# Patient Record
Sex: Female | Born: 1960
Health system: Southern US, Community
[De-identification: ages and names within clinical notes are randomized; demographics above are authoritative.]

## PROBLEM LIST (undated history)

## (undated) DIAGNOSIS — E119 Type 2 diabetes mellitus without complications: Secondary | ICD-10-CM

## (undated) DIAGNOSIS — I10 Essential (primary) hypertension: Secondary | ICD-10-CM

## (undated) DIAGNOSIS — E7800 Pure hypercholesterolemia, unspecified: Secondary | ICD-10-CM

## (undated) DIAGNOSIS — D649 Anemia, unspecified: Secondary | ICD-10-CM

## (undated) DIAGNOSIS — E669 Obesity, unspecified: Secondary | ICD-10-CM

## (undated) HISTORY — DX: Anemia, unspecified: D64.9

## (undated) HISTORY — DX: Obesity, unspecified: E66.9

## (undated) HISTORY — PX: ABDOMINAL HYSTERECTOMY: SHX81

## (undated) HISTORY — PX: GASTRIC BYPASS: SHX52

## (undated) HISTORY — DX: Pure hypercholesterolemia, unspecified: E78.00

## (undated) HISTORY — DX: Essential (primary) hypertension: I10

---

## 1998-06-24 ENCOUNTER — Encounter: Admission: RE | Admit: 1998-06-24 | Discharge: 1998-09-22 | Payer: Self-pay | Admitting: Obstetrics and Gynecology

## 1998-07-02 ENCOUNTER — Other Ambulatory Visit: Admission: RE | Admit: 1998-07-02 | Discharge: 1998-07-02 | Payer: Self-pay | Admitting: *Deleted

## 1998-08-07 ENCOUNTER — Ambulatory Visit (HOSPITAL_COMMUNITY): Admission: RE | Admit: 1998-08-07 | Discharge: 1998-08-07 | Payer: Self-pay | Admitting: Obstetrics and Gynecology

## 1998-08-07 ENCOUNTER — Encounter: Payer: Self-pay | Admitting: Obstetrics and Gynecology

## 1998-11-04 ENCOUNTER — Observation Stay (HOSPITAL_COMMUNITY): Admission: AD | Admit: 1998-11-04 | Discharge: 1998-11-05 | Payer: Self-pay | Admitting: Obstetrics and Gynecology

## 1998-11-20 ENCOUNTER — Ambulatory Visit (HOSPITAL_COMMUNITY): Admission: RE | Admit: 1998-11-20 | Discharge: 1998-11-20 | Payer: Self-pay | Admitting: Obstetrics and Gynecology

## 1998-11-20 ENCOUNTER — Encounter: Payer: Self-pay | Admitting: Obstetrics and Gynecology

## 1998-12-04 ENCOUNTER — Encounter: Payer: Self-pay | Admitting: Obstetrics and Gynecology

## 1998-12-04 ENCOUNTER — Inpatient Hospital Stay (HOSPITAL_COMMUNITY): Admission: RE | Admit: 1998-12-04 | Discharge: 1998-12-04 | Payer: Self-pay | Admitting: Obstetrics

## 1998-12-09 ENCOUNTER — Encounter (HOSPITAL_COMMUNITY): Admission: RE | Admit: 1998-12-09 | Discharge: 1999-01-13 | Payer: Self-pay | Admitting: *Deleted

## 1998-12-16 ENCOUNTER — Encounter: Payer: Self-pay | Admitting: Obstetrics & Gynecology

## 1998-12-23 ENCOUNTER — Inpatient Hospital Stay (HOSPITAL_COMMUNITY): Admission: AD | Admit: 1998-12-23 | Discharge: 1998-12-23 | Payer: Self-pay | Admitting: Obstetrics & Gynecology

## 1998-12-31 ENCOUNTER — Encounter: Payer: Self-pay | Admitting: Obstetrics & Gynecology

## 1999-01-12 ENCOUNTER — Encounter (INDEPENDENT_AMBULATORY_CARE_PROVIDER_SITE_OTHER): Payer: Self-pay | Admitting: Specialist

## 1999-01-12 ENCOUNTER — Inpatient Hospital Stay (HOSPITAL_COMMUNITY): Admission: AD | Admit: 1999-01-12 | Discharge: 1999-01-15 | Payer: Self-pay | Admitting: Obstetrics and Gynecology

## 1999-01-16 ENCOUNTER — Encounter: Admission: RE | Admit: 1999-01-16 | Discharge: 1999-04-16 | Payer: Self-pay | Admitting: Obstetrics and Gynecology

## 1999-06-24 ENCOUNTER — Other Ambulatory Visit: Admission: RE | Admit: 1999-06-24 | Discharge: 1999-06-24 | Payer: Self-pay | Admitting: Obstetrics and Gynecology

## 2000-07-25 ENCOUNTER — Ambulatory Visit (HOSPITAL_COMMUNITY): Admission: RE | Admit: 2000-07-25 | Discharge: 2000-07-25 | Payer: Self-pay | Admitting: Internal Medicine

## 2001-12-12 ENCOUNTER — Encounter: Payer: Self-pay | Admitting: Internal Medicine

## 2001-12-12 ENCOUNTER — Ambulatory Visit (HOSPITAL_COMMUNITY): Admission: RE | Admit: 2001-12-12 | Discharge: 2001-12-12 | Payer: Self-pay | Admitting: Internal Medicine

## 2001-12-14 ENCOUNTER — Other Ambulatory Visit: Admission: RE | Admit: 2001-12-14 | Discharge: 2001-12-14 | Payer: Self-pay | Admitting: Obstetrics and Gynecology

## 2002-03-28 ENCOUNTER — Ambulatory Visit (HOSPITAL_COMMUNITY): Admission: RE | Admit: 2002-03-28 | Discharge: 2002-03-28 | Payer: Self-pay | Admitting: Internal Medicine

## 2002-03-28 ENCOUNTER — Encounter: Payer: Self-pay | Admitting: Internal Medicine

## 2003-01-23 ENCOUNTER — Other Ambulatory Visit: Admission: RE | Admit: 2003-01-23 | Discharge: 2003-01-23 | Payer: Self-pay | Admitting: Obstetrics and Gynecology

## 2003-01-23 ENCOUNTER — Ambulatory Visit (HOSPITAL_COMMUNITY): Admission: RE | Admit: 2003-01-23 | Discharge: 2003-01-23 | Payer: Self-pay | Admitting: Obstetrics and Gynecology

## 2003-05-03 ENCOUNTER — Ambulatory Visit (HOSPITAL_COMMUNITY): Admission: RE | Admit: 2003-05-03 | Discharge: 2003-05-03 | Payer: Self-pay | Admitting: General Surgery

## 2003-05-08 ENCOUNTER — Encounter: Admission: RE | Admit: 2003-05-08 | Discharge: 2003-05-08 | Payer: Self-pay | Admitting: General Surgery

## 2003-05-10 ENCOUNTER — Encounter: Admission: RE | Admit: 2003-05-10 | Discharge: 2003-08-08 | Payer: Self-pay | Admitting: General Surgery

## 2003-05-11 ENCOUNTER — Ambulatory Visit (HOSPITAL_BASED_OUTPATIENT_CLINIC_OR_DEPARTMENT_OTHER): Admission: RE | Admit: 2003-05-11 | Discharge: 2003-05-11 | Payer: Self-pay | Admitting: General Surgery

## 2003-05-17 ENCOUNTER — Encounter: Admission: RE | Admit: 2003-05-17 | Discharge: 2003-05-17 | Payer: Self-pay | Admitting: General Surgery

## 2003-06-04 ENCOUNTER — Ambulatory Visit (HOSPITAL_COMMUNITY): Admission: RE | Admit: 2003-06-04 | Discharge: 2003-06-04 | Payer: Self-pay | Admitting: *Deleted

## 2003-06-04 ENCOUNTER — Encounter (INDEPENDENT_AMBULATORY_CARE_PROVIDER_SITE_OTHER): Payer: Self-pay | Admitting: *Deleted

## 2003-08-20 ENCOUNTER — Inpatient Hospital Stay (HOSPITAL_COMMUNITY): Admission: RE | Admit: 2003-08-20 | Discharge: 2003-08-22 | Payer: Self-pay | Admitting: General Surgery

## 2003-08-20 ENCOUNTER — Encounter (INDEPENDENT_AMBULATORY_CARE_PROVIDER_SITE_OTHER): Payer: Self-pay | Admitting: Specialist

## 2003-08-27 ENCOUNTER — Encounter: Admission: RE | Admit: 2003-08-27 | Discharge: 2003-11-25 | Payer: Self-pay | Admitting: General Surgery

## 2004-02-11 ENCOUNTER — Ambulatory Visit (HOSPITAL_COMMUNITY): Admission: RE | Admit: 2004-02-11 | Discharge: 2004-02-11 | Payer: Self-pay | Admitting: Obstetrics and Gynecology

## 2004-03-11 ENCOUNTER — Other Ambulatory Visit: Admission: RE | Admit: 2004-03-11 | Discharge: 2004-03-11 | Payer: Self-pay | Admitting: Obstetrics and Gynecology

## 2005-03-28 IMAGING — RF DG UGI W/ GASTROGRAFIN
13 series · 13 of 13 positions shown · non-contrast
Comparison: none

CLINICAL DATA: Status-post gastric bypass procedure.  
GASTROGRAFIN STUDY 
Scout view reveals postoperative changes with drain and surgical clips.  The patient ingested Gastrografin.  Upon first swallow, it became obvious that there is a significant stenosis at the gastro-jejunal anastomosis.   Further ingestion of contrast was terminated at this point.  The patient was kept in a predominately erect position for 20 minutes and contrast did not completely empty from the gastric pouch or esophagus.  The patient was sent back to room with orders to keep head of bead at 30 degrees for the next 8 hours.  Exam is limited with respect to detecting leak.  No leak is identified.  Results discussed with Dr. Kofa.  As contrast filled views of the jejunal loop are somewhat limited given the amount of contrast ingested, distal anastomotic leak is difficult to exclude although no obvious leak demonstrated.  
IMPRESSION
Significant narrowing gastro-jejunal anastomosis.  This limits exam as significant amount of contrast was not ingested.  No leak identified on this limited exam.

[Series 1: run · 1 of 1 slices shown (1 of 10)]
[im 1/1]
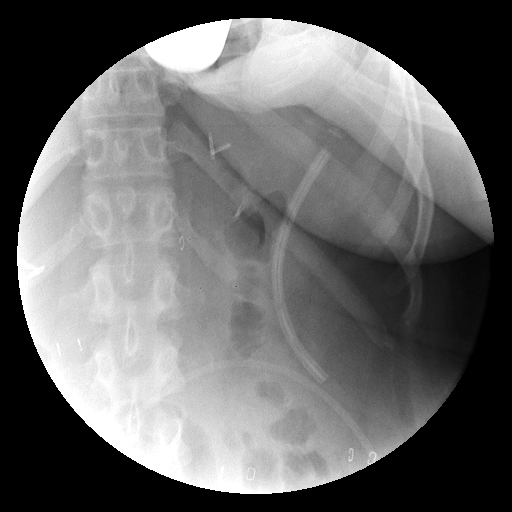

[Series 2: run · 1 of 1 slices shown (2 of 10)]
[im 1/1]
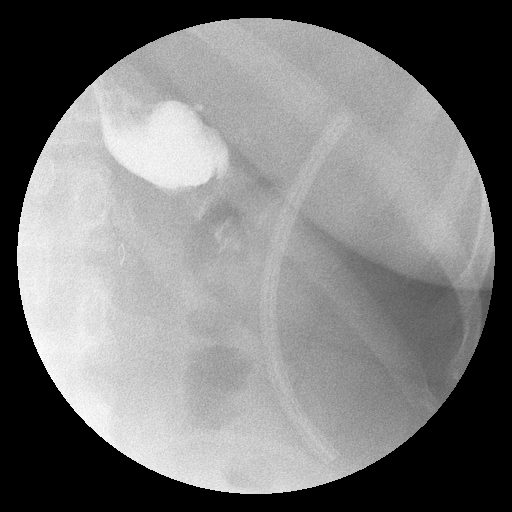

[Series 3: run · 1 of 1 slices shown (3 of 10)]
[im 1/1]
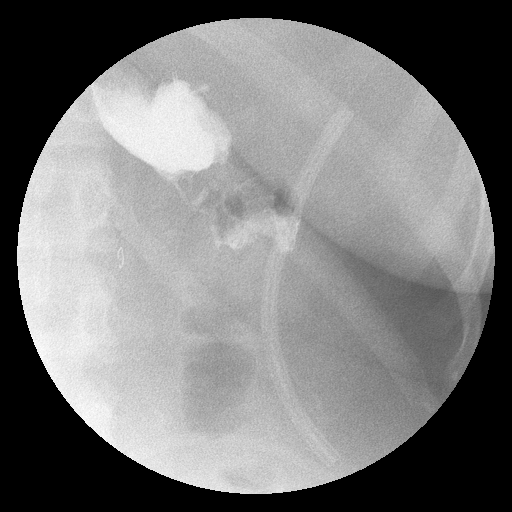

[Series 4: run · 1 of 1 slices shown (4 of 10)]
[im 1/1]
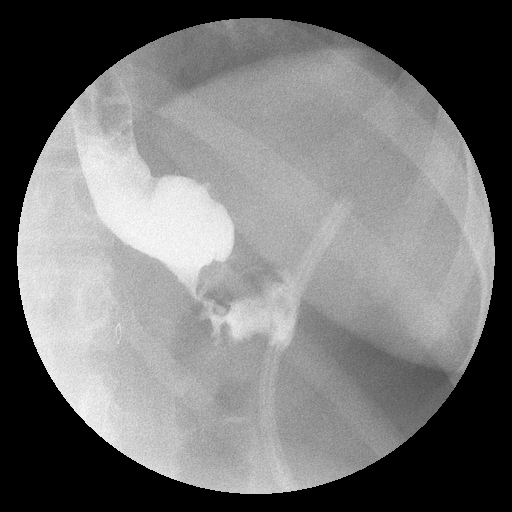

[Series 5: run · 1 of 1 slices shown (5 of 10)]
[im 1/1]
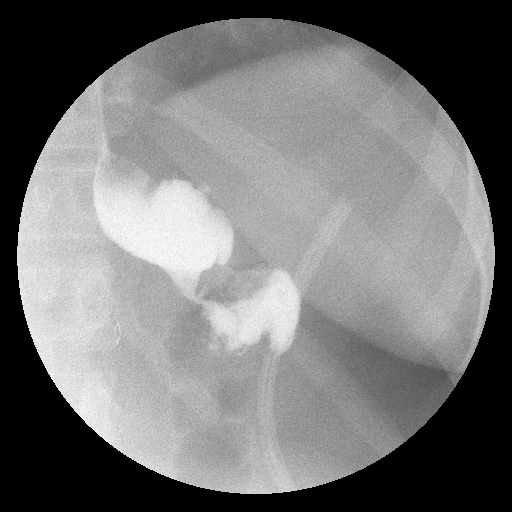

[Series 6: run · 1 of 1 slices shown (6 of 10)]
[im 1/1]
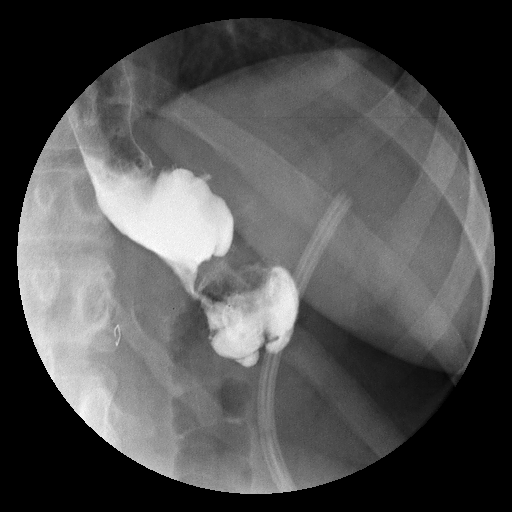

[Series 7: run · 1 of 1 slices shown (7 of 10)]
[im 1/1]
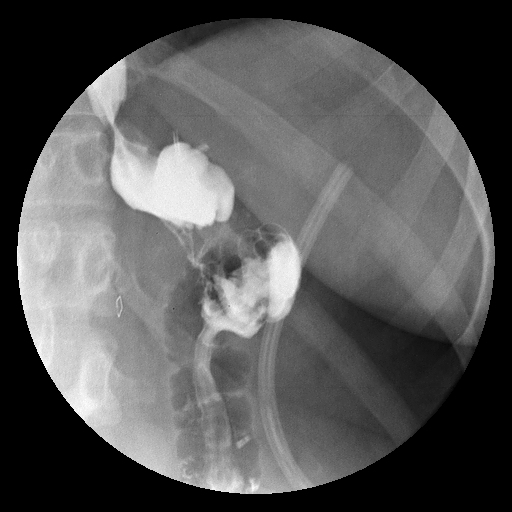

[Series 8: run · 1 of 1 slices shown (8 of 10)]
[im 1/1]
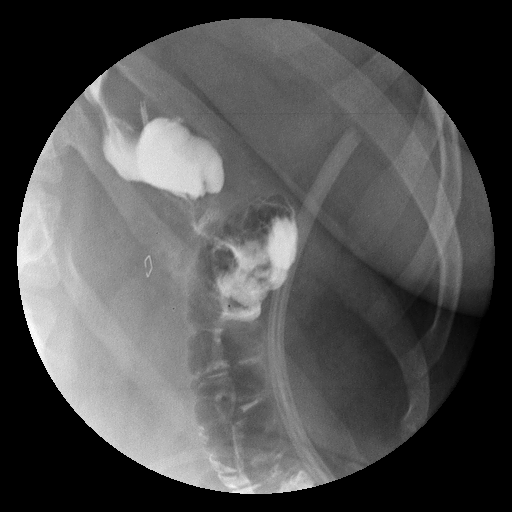

[Series 9: run · 1 of 1 slices shown (9 of 10)]
[im 1/1]
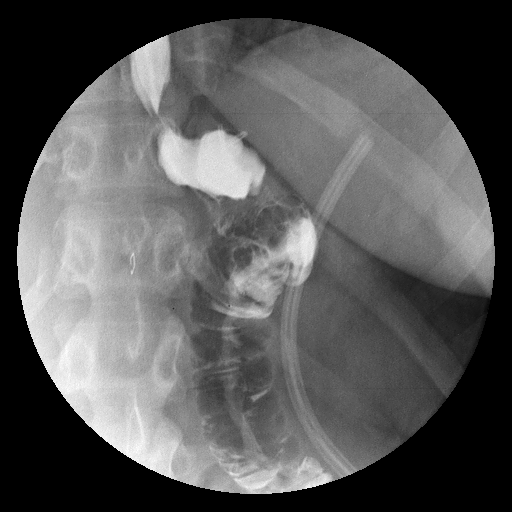

[Series 10: run · 1 of 1 slices shown (10 of 10)]
[im 1/1]
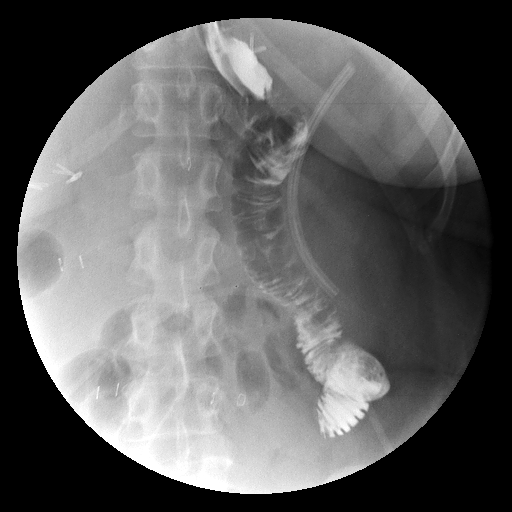

[Series 1001: view not recorded · 0.20mm/px · 1 of 1 slices shown (1 of 3)]
[im 1/1]
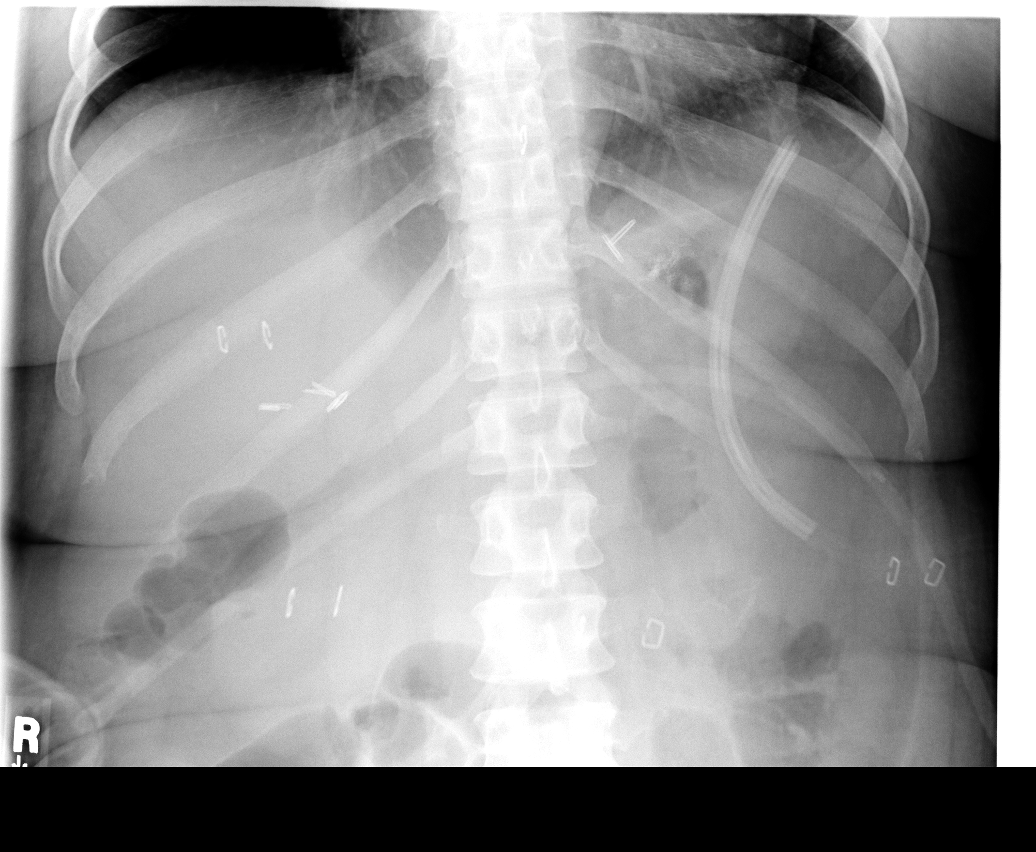

[Series 1002: view not recorded · 0.20mm/px · 1 of 1 slices shown (2 of 3)]
[im 1/1]
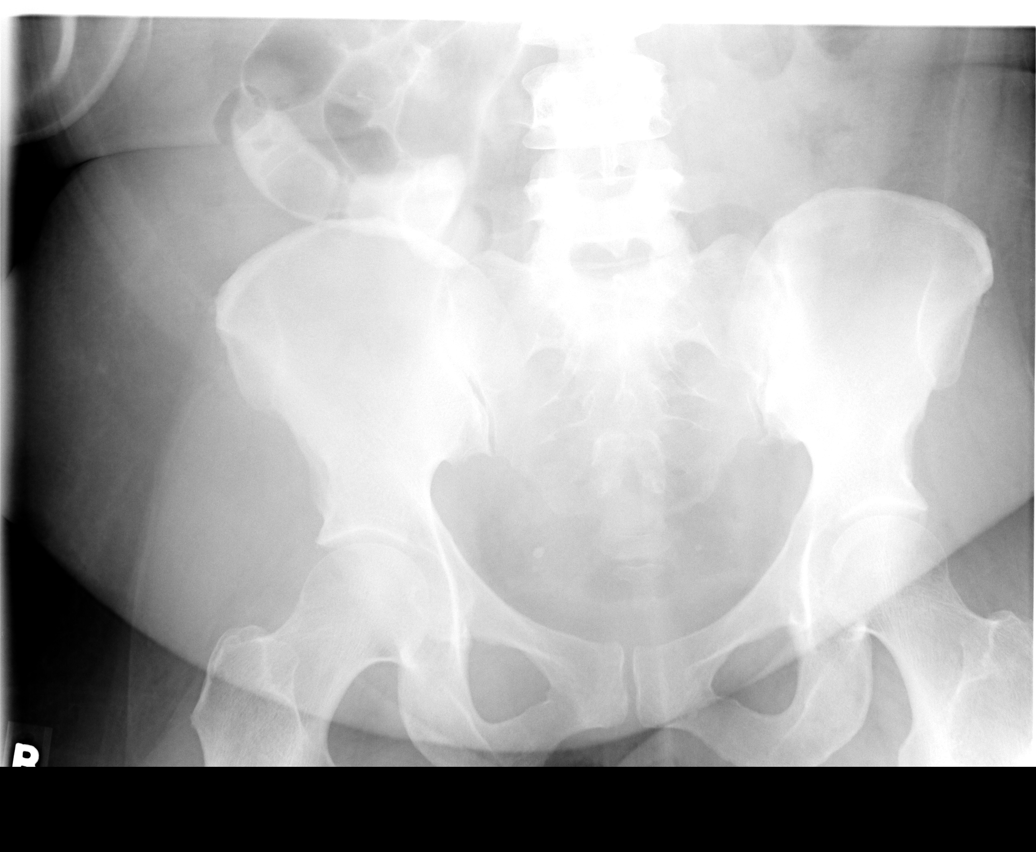

[Series 1003: view not recorded · 0.20mm/px · 1 of 1 slices shown (3 of 3)]
[im 1/1]
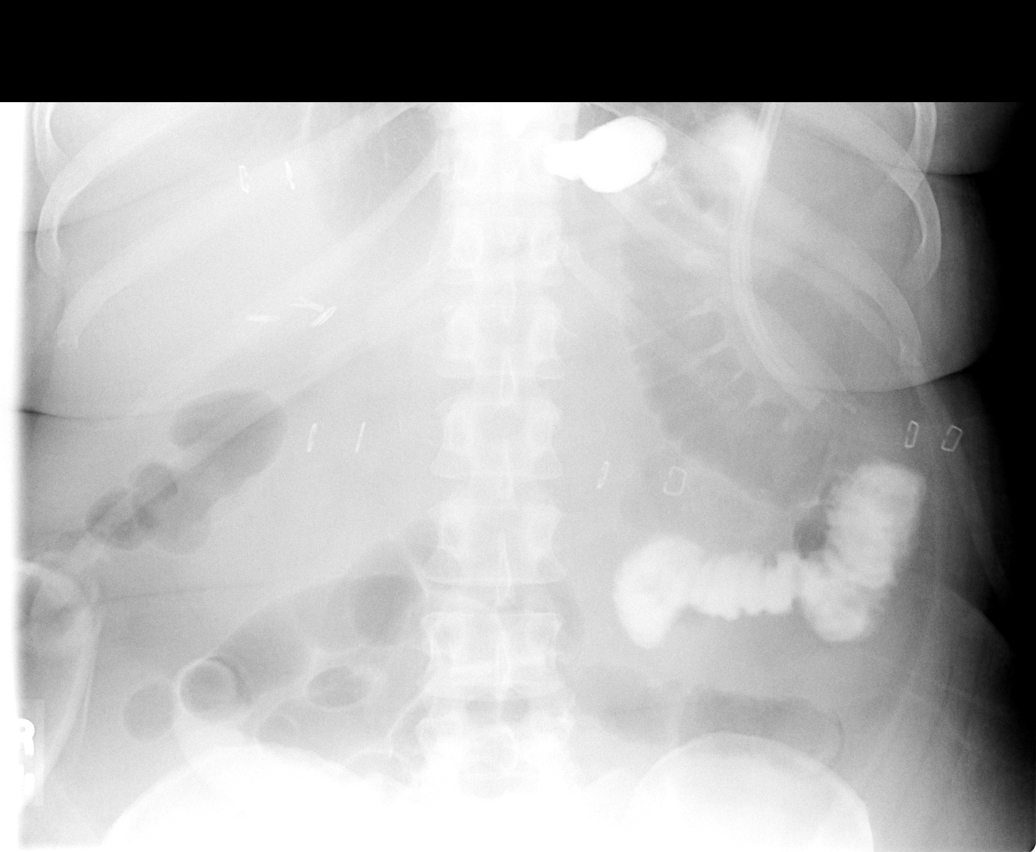

[13 of 13 positions shown; findings below may reference images not displayed]

## 2005-04-05 ENCOUNTER — Ambulatory Visit (HOSPITAL_COMMUNITY): Admission: RE | Admit: 2005-04-05 | Discharge: 2005-04-05 | Payer: Self-pay | Admitting: Obstetrics and Gynecology

## 2005-05-04 ENCOUNTER — Other Ambulatory Visit: Admission: RE | Admit: 2005-05-04 | Discharge: 2005-05-04 | Payer: Self-pay | Admitting: Obstetrics and Gynecology

## 2006-05-04 ENCOUNTER — Ambulatory Visit (HOSPITAL_COMMUNITY): Admission: RE | Admit: 2006-05-04 | Discharge: 2006-05-04 | Payer: Self-pay | Admitting: Obstetrics and Gynecology

## 2006-11-17 ENCOUNTER — Emergency Department (HOSPITAL_COMMUNITY): Admission: EM | Admit: 2006-11-17 | Discharge: 2006-11-17 | Payer: Self-pay | Admitting: Emergency Medicine

## 2007-03-08 ENCOUNTER — Encounter (INDEPENDENT_AMBULATORY_CARE_PROVIDER_SITE_OTHER): Payer: Self-pay | Admitting: Obstetrics and Gynecology

## 2007-03-08 ENCOUNTER — Ambulatory Visit (HOSPITAL_COMMUNITY): Admission: RE | Admit: 2007-03-08 | Discharge: 2007-03-09 | Payer: Self-pay | Admitting: Obstetrics and Gynecology

## 2008-02-07 ENCOUNTER — Ambulatory Visit (HOSPITAL_COMMUNITY): Admission: RE | Admit: 2008-02-07 | Discharge: 2008-02-07 | Payer: Self-pay | Admitting: Internal Medicine

## 2008-08-01 ENCOUNTER — Emergency Department (HOSPITAL_COMMUNITY): Admission: EM | Admit: 2008-08-01 | Discharge: 2008-08-01 | Payer: Self-pay | Admitting: Family Medicine

## 2010-01-15 ENCOUNTER — Ambulatory Visit (HOSPITAL_COMMUNITY): Admission: RE | Admit: 2010-01-15 | Discharge: 2010-01-15 | Payer: Self-pay | Admitting: Obstetrics and Gynecology

## 2010-07-21 NOTE — Op Note (Signed)
Miranda Hernandez, Miranda Hernandez              ACCOUNT NO.:  [id number]   MEDICAL RECORD NO.:  [id number]          PATIENT TYPE:  OIB   LOCATION:  9310                          FACILITY:  [facility]   PHYSICIAN:  Janine Limbo, M.D.DATE OF BIRTH:  [birth year]   DATE OF PROCEDURE:  DATE OF DISCHARGE:                               OPERATIVE REPORT   PREOPERATIVE DIAGNOSIS:  1. Menorrhagia  2. Anemia (hemoglobin 7.6)  3. Fibroid uterus.  4. High-grade squamous intraepithelial lesion of the cervix.  5. Obesity.  6. Two prior cesarean sections.  7. Hypertension.   POSTOPERATIVE DIAGNOSIS:  1. Menorrhagia  2. Anemia (hemoglobin 7.6)  3. Fibroid uterus.  4. High-grade squamous intraepithelial lesion of the cervix.  5. Obesity.  6. Two prior cesarean sections.  7. Hypertension  8. Pelvic adhesions.   PROCEDURE:  Vaginal hysterectomy with uterine morcellation.   SURGEON:  Dr. Leonard Schwartz   FIRST ASSISTANT:  Dr. Dierdre Forth.   ANESTHESIA:  General.   DISPOSITION:  Miranda Hernandez is a 50 year old female, para 2-0-1-2, who  presents with the above-mentioned diagnosis.  Hormonal therapy has not  controlled her bleeding.  The patient wishes to proceed with definitive  therapy at this time.  She understands the indications for her surgical  procedure and she accepts the risks of, but not limited to, anesthetic  complications, bleeding, infection, and possible damage to the  surrounding organs.   FINDINGS:  A 10-12 weeks size multi fibroid uterus was present.  The  patient has had two prior cesarean sections and she was noted to have  adhesions in the right adnexa.  The fallopian tube and the ovaries  otherwise appeared normal.   PROCEDURE:  The patient was taken to the operating room where a general  anesthetic was given.  The patient's lower abdomen, perineum, and vagina  were prepped with multiple layers of Betadine.  A Foley catheter was  placed in the bladder.  Examination under  anesthesia was performed.  The  cervix was injected with a diluted solution of Pitressin and saline.  A  circumferential incision was made around the cervix and the vaginal  cuff.  Mucosa was advanced anteriorly and posteriorly.  The posterior  cul-de-sac was sharply entered.  Attempts were made to enter the  anterior cul-de-sac but these were initially unsuccessful.  Alternating  from left-to-right the uterosacral ligaments, paracervical tissues,  parametrial tissues, and uterine arteries were clamped, cut, sutured,  and tied securely.  Attempts were made to invert the uterus through the  posterior colpotomy.  These were unsuccessful.  We then advanced the  mucosa anteriorly even further.  We were able to sharply enter the  anterior cul-de-sac.  We continued to clamp the parametrial tissues as  we approached the adnexa.  We were able to isolate the left adnexal  area.  The area was clamped, cut, free tied, and then suture ligated.  Difficulty was encountered clamping the right upper pedicle.  We then  began to morcellate the uterus.  Bleeding was encountered.  Hemostasis  was achieved using clamps and figure-of-eight sutures.  We were  able to  finally isolate the right upper pedicle.  The uterus was transected from  the operative field.  Bleeding was encountered and again hemostasis was  achieved using figure-of-eight sutures.  The uterus was handed from the  operative field.  Once hemostasis was achieved, the sutures attached to  the uterosacral ligaments were brought out through the vaginal angles.  A McCall culdoplasty suture was placed in the posterior cul-de-sac  incorporating the uterosacral ligaments bilaterally and the posterior  peritoneum.  A final check was made for hemostasis and hemostasis was  adequate throughout.  The bowel and the ovaries were carefully inspected  and there was no evidence of damage.  We then closed the vaginal cuff  using figure-of-eight sutures  incorporating the anterior vaginal mucosa,  the anterior peritoneum, the posterior peritoneum, and then the  posterior vaginal mucosa.  The McCall culdoplasty suture was tied  securely and the apex of the vagina was noted to elevate into the mid  pelvis.  Sponge, needle, and instrument counts were correct on two  occasions. The estimated blood loss for the procedure was 600 mL.  The  patient tolerated her procedure well.  The patient was noted to drain  500 mL of clear yellow urine at the end of her procedure.  She received  3 liters of IV fluid during the operative procedure.  The patient was  awakened from her anesthetic without difficulty and taken to the  recovery room in stable condition.  The uterus was sent to pathology for  evaluation.      Janine Limbo, M.D.  Electronically Signed     AVS/MEDQ  D:  03/09/2007  T:  03/09/2007  Job:  [id number]

## 2010-07-21 NOTE — H&P (Signed)
NAME:  Miranda Hernandez, Miranda Hernandez NO.:  [id number]   MEDICAL RECORD NO.:  [id number]          PATIENT TYPE:  AMB   LOCATION:  SDC                           FACILITY:  [facility]   PHYSICIAN:  Janine Limbo, M.D.DATE OF BIRTH:  15-Mar-1960   DATE OF ADMISSION:  DATE OF DISCHARGE:                              HISTORY & PHYSICAL   HISTORY OF PRESENT ILLNESS:  Ms. Miranda Hernandez is a 50 year old female, para 61-  0-1-2, who presents for a vaginal hysterectomy.  The patient has been  followed at the [facility] and Gyne[facility]  [facility].  The the patient complains of  menorrhagia.  She has a known history of fibroids.  An ultrasound was  performed and it showed a uterus that measures 10.91 cm x 7.29 cm.  Multiple fibroids were noted with the largest fibroid measuring 3.66 cm.  The ovaries appeared normal on ultrasound.  An endometrial biopsy was  performed that showed a benign endometrium.  The patient's most recent  Pap smear showed a high-grade squamous intraepithelial lesion.  Colposcopy and biopsies were performed and the patient was found to have  a high-grade squamous intraepithelial lesion.  The endocervical  curettage was negative.  The patient has decided to proceed with  definitive therapy.  The patient has had two cesarean sections in the  past and a tubal ligation.   DRUG ALLERGIES:  NO KNOWN DRUG ALLERGIES.   OBSTETRICAL HISTORY:  The patient had a cesarean section and tubal  ligation in November 2000.  She had a cesarean section at [redacted] weeks  gestation in 1988.  In 1998, the patient had an intrauterine fetal  demise at [redacted] weeks gestation.   PAST MEDICAL HISTORY:  1. Hypertension.  2. History of gestational diabetes.  3. History of anemia and her hemoglobin has been as low as 8.5.  4. Bariatric surgery in the past and has done well.   CURRENT MEDICATIONS:  Include Lotrel, Toprol, hydrochlorothiazide, iron,  B12 shots, and  multivitamins.   SOCIAL HISTORY:  The patient denies cigarette use, alcohol use, and  recreational drug use.   REVIEW OF SYSTEMS:  Please see history of present illness.   FAMILY HISTORY:  Noncontributory   PHYSICAL EXAMINATION:  VITAL SIGNS:  Weight is 198 pounds.  HEENT:  Within normal limits.  CHEST:  Clear.  HEART:  Regular rate and rhythm.  BREASTS:  Without masses.  ABDOMEN:  Soft and nontender.  EXTREMITIES:  Grossly normal.  NEUROLOGIC:  Grossly normal.  PELVIC:  External genitalia is normal.  Vagina is normal.  Cervix is  nontender (colposcopic changes are noted).  Uterus is 10-week size and  irregular.  Adnexa no masses.  Rectovaginal examination confirms.   ASSESSMENT:  1. Menorrhagia.  2. Anemia.  3. Fibroids.  4. High-grade squamous intraepithelial lesion.  5. Hypertension.  6. Status post two cesarean sections and one bilateral tubal ligation.  7. Status post bariatric surgery.   PLAN:  The patient will undergo a vaginal hysterectomy.  The patient  understands the indications for her surgical procedure and she accepts  the risks of, but not limited to, anesthetic complications, bleeding,  infection, and possible damage to surrounding organs.      Janine Limbo, M.D.  Electronically Signed     AVS/MEDQ  D:  03/05/2007  T:  03/06/2007  Job:  [id number]

## 2010-07-21 NOTE — Discharge Summary (Signed)
NAMEVIVECA, Miranda Hernandez              ACCOUNT NO.:  [id number]   MEDICAL RECORD NO.:  [id number]          PATIENT TYPE:  OIB   LOCATION:  9310                          FACILITY:  [facility]   PHYSICIAN:  Janine Limbo, M.D.DATE OF BIRTH:  06-15-1960   DATE OF ADMISSION:  03/08/2007  DATE OF DISCHARGE:  03/09/2007                               DISCHARGE SUMMARY   PREOPERATIVE DIAGNOSIS:  1. Menorrhagia.  2. Anemia (hemoglobin 7.6).  3. Fibroid uterus.  4. High-grade squamous intraepithelial lesion of the cervix.  5. Obesity.  6. Two prior cesarean sections.  7. Hypertension.   POSTOPERATIVE DIAGNOSIS:  1. Menorrhagia.  2. Anemia (hemoglobin 7.6).  3. Fibroid uterus.  4. High-grade squamous intraepithelial lesion of the cervix.  5. Obesity.  6. Two prior cesarean sections.  7. Hypertension.  8. Pelvic adhesions.   PROCEDURE THIS ADMISSION:  March 08, 2007  1. Vaginal hysterectomy with uterine morcellation  2. Blood transfusion for 2 units.   HISTORY OF PRESENT ILLNESS:  Ms. Miranda Hernandez is a 50 year old female, para 2-  0-1-2, who presents with the above-mentioned diagnosis.  She has decided  to proceed with definitive therapy.  Please see her dictated history and  physical exam for details.   ADMISSION EXAM:  The patient's weight is 198 pounds.  The uterus is 10-  week size and irregular.   HOSPITAL COURSE:  On the day of admission the patient was taken to the  operating room where a general anesthetic was given.  The patient had a  vaginal hysterectomy.  Operative findings included a 10-week size  fibroid uterus (178 grams).  There were adhesions in the right adnexa.  The procedure was complicated by bleeding from the adhesions in the  right adnexa.  The patient did tolerated her procedure well.  The  estimated blood loss for the procedure was 600 mL.  The patient  tolerated her procedure well.  Her postoperative hemoglobin was 6.4.  The patient was transfused 2 units of  packed red blood cells.  Her post  transfusion hemoglobin was 8.2.  The patient remained afebrile.  The  patient was not orthostatic.  She quickly tolerated a regular diet.  She  was ready for discharge on March 09, 2007.   DISCHARGE MEDICATIONS:  1. Vicodin 1 or 2 tablets q.4 h. as needed for pain.  2. Acetaminophen 1 or 2 tablets q.6 h. as needed for mild-to-moderate      pain.  3. Zofran 8 mg q.8 h. as needed for nausea.  4. Iron 325 mg twice each day for 6-8 weeks.  5. Stool softeners daily.  6. The patient will continue her preoperative medications.   DISCHARGE INSTRUCTIONS:  The patient will return to see Dr. Stefano Gaul in  6 weeks for followup examination.  She will refrain from driving for 1  week, heavy lifting for 4 weeks, and intercourse for 6 weeks.  She will  call for questions or concerns.  She was given a copy of the  postoperative instruction sheet as prepared by the [facility].  FINAL PATHOLOGY:  Pending at the time of discharge.      Janine Limbo, M.D.  Electronically Signed     AVS/MEDQ  D:  03/09/2007  T:  03/09/2007  Job:  [id number]

## 2010-07-24 NOTE — Discharge Summary (Signed)
[facility]  Patient:    Miranda Hernandez                        MRN: [id number] Adm. Date:  [id number] Disc. Date: 01/15/99 Attending:  Leonard Schwartz Dictator:   Erin Sons, C.N.M.                           Discharge Summary  ADMITTING DIAGNOSES: 1. Term pregnancy. 2. Complete breech presentation. 3. Previous cesarean section. 4. Desires sterilization. 5. Obesity.  DISCHARGE DIAGNOSES: 1. Term pregnancy. 2. Complete breech presentation. 3. Previous cesarean section. 4. Desires sterilization. 5. Obesity.  PROCEDURES: 1. Repeat low transverse cesarean section. 2. Bilateral tubal ligation. 3. Spinal anesthesia.  HOSPITAL COURSE:  [name] is a 50 year old gravida 3, para 1-1-0-1, who was admitted on January 12, 1999 at 37-5/7ths weeks with history of a previous cesarean section who is admitted for a planned C-section.  Patients pregnancy had been remarkable for 1) history of intrauterine fetal death at 21 weeks in a previous  pregnancy, 2) insulin-dependent diabetes, 3) chronic hypertension, 4) history of kidney stone, 5) advanced maternal age with normal amniocentesis, 6) history of  heart murmur without symptoms or antibiotics, and 8) positive group B strep.  Patient was taken to the operating room where a repeat low transverse cesarean section was performed under spinal anesthesia by Dr. Marline Backbone. Findings were a viable female by the name of Gerri Spore, 7 pounds 5 ounces, Apgars were 8 and , from a complete breech presentation.  Normal tubes and ovaries were noted and bilateral tubal ligation was also performed.  Estimated blood loss was 500 cc.  Patient tolerated the procedure well.  Infant was taken to the full-term nursery.  By postoperative day #1 patient was doing well.  She was using Benadryl for itching instead of Nubain.  Patient was doing both breast and bottle feeding since the aby was not feeding very  well at the breast.  Hemoglobin was 8.2 which was down from 10.4 pre-delivery.  The platelets were 137, her WBC was 10.1.  Her incision was in good condition and her physical examination was within normal limits.  Her blood pressure was stable.  The plan was made to repeat the CBC in the morning. Patient was continued on Aldomet.  She was up ad lib and having no issues with the anemia. By postoperative day #2 she was doing well.  Her CBGs were within normal limits. She had had a Jackson-Pratt drain placed during surgery and she was having less  than 5 cc noted.  Her hemoglobin was 7.9.  Nutrition consult was obtained and education was given.  Jackson-Pratt drain was removed on the evening of January 14, 1999 without difficulty by Dr. Stefano Gaul.  On January 15, 1999, postoperative day #3, patient was doing well.  She was breast and bottle feeding.  Hemoglobin was  7.5, platelets were 139.  Physical examination was within normal limits. Incision was clean, dry and intact with staples intact.  Dr. Pennie Rushing saw the patient and  determined that staples would remain in until November 13 due to the patients diabetes and obesity.  Patient was deemed then to have received the full benefit of her hospital stay nd was discharged home.  DISCHARGE INSTRUCTIONS:  Per [facility] handout.  DISCHARGE MEDICATIONS: 1. Motrin 600 mg p.o. q.6h. p.r.n. pain. 2. Tylox 1-2 p.o. q.3-4h. p.r.n. pain. 3. Iron  supplementation one p.o. b.i.d. 4. Aldomet 500 mg p.o. t.i.d. 5. Prenatal vitamin one p.o. q.d.  FOLLOWUP:  January 19, 1999 at [facility] for staple discontinuance and then at six weeks postpartum for routine postpartum exam.DD:  01/15/99 TD:  01/16/99 Job: [id number]

## 2010-07-24 NOTE — Discharge Summary (Signed)
NAME:  Miranda, Hernandez                        ACCOUNT NO.:  [id number]   MEDICAL RECORD NO.:  [id number]                   PATIENT TYPE:  INP   LOCATION:  0473                                 FACILITY:  [facility]   PHYSICIAN:  Sharlet Salina T. Hoxworth, M.D.          DATE OF BIRTH:  11/19/1960   DATE OF ADMISSION:  08/20/2003  DATE OF DISCHARGE:  08/22/2003                                 DISCHARGE SUMMARY   DISCHARGE DIAGNOSIS:  1. Morbid obesity.  2. Cholelithiasis.   OPERATIONS AND PROCEDURES:  Laparoscopic Roux-Y gastric bypass and  cholecystectomy with cholangiogram on August 20, 2003.   HISTORY OF PRESENT ILLNESS:  Miranda Hernandez is a 50 year old black female with a  many year history of progressive obesity, unresponsive to medical  management.  After extensive preoperative discussion and work-up, we have  elected to proceed with laparoscopic Roux-Y gastric bypass for correction of  her obesity.  Preoperative work-up also revealed gallstones, and  cholecystectomy is planned.   PAST MEDICAL HISTORY:  She has had cesarean sections x2 with Pfannenstiel  incisions.  She was treated for hypertension, adult onset diabetes mellitus,  oral agent controlled.   MEDICATIONS:  1. Metoprolol 100 mg daily.  2. Lotrel 5/20 two a day.  3. Lasix 40 mg daily.  4. Avandia 2000 twice daily.  5. Amaryl 4 mg daily.  6. Tenex 1 mg daily.  7. Aspirin 81 daily, stopped a week ago.   ALLERGIES:  No known drug allergies.   SOCIAL HISTORY/FAMILY HISTORY/REVIEW OF SYSTEMS:  See dictated H&P.   PERTINENT PHYSICAL EXAMINATION:  She is 5'5, 267 pounds, for a BMI of 44.4.  The remainder of the physical exam is unremarkable, except for obesity.   HOSPITAL COURSE:  The patient was admitted on the morning of the procedure  and underwent an uneventful laparoscopic Roux-Y gastric bypass and  cholecystectomy with cholangiogram.  Her postoperative was very smooth.  She  had a negative barium swallow on the first  postoperative day, and was begun  on clear liquids, which were able to be advanced to full liquids quickly  without nausea or pain.  She was felt ready for discharge on the second  postoperative day.  She was ambulatory, tolerating her diet well.   MEDICATIONS:  The same as at admission, plus Roxicet for pain.   FOLLOW UP:  In our office in 1 week.                                               Lorne Skeens. Hoxworth, M.D.    Tory Emerald  D:  09/11/2003  T:  09/11/2003  Job:  [id number]   cc:   Margaretmary Bayley, M.D.  [address]  [place]  [place] [address]  Fax: [phone]

## 2010-07-24 NOTE — Op Note (Signed)
NAME:  Miranda Hernandez, Miranda Hernandez                        ACCOUNT NO.:  [id number]   MEDICAL RECORD NO.:  [id number]                   PATIENT TYPE:  INP   LOCATION:  0001                                 FACILITY:  [facility]   PHYSICIAN:  Sandria Bales. Ezzard Standing, M.D.               DATE OF BIRTH:  1960/06/12   DATE OF PROCEDURE:  08/20/2003  DATE OF DISCHARGE:                                 OPERATIVE REPORT   PREOPERATIVE DIAGNOSIS:  Morbid obesity, status post laparoscopic Roux-en-Y  gastrojejunostomy.   POSTOPERATIVE DIAGNOSIS:  Morbid obesity with body mass index of  approximately 44, status post laparoscopic Roux-en-Y gastrojejunostomy.   PROCEDURE:  Esophagogastroscopy.   SURGEON:  Sandria Bales. Ezzard Standing, M.D.   ANESTHESIA:  General endotracheal.   ESTIMATED BLOOD LOSS:  Minimal.   INDICATION FOR PROCEDURE:  [name] is a 50 year old female who has  undergone a laparoscopic Roux-en-Y gastrojejunostomy by Lorne Skeens.  Hoxworth, M.D., for morbid obesity with a BMI of approximately 44.  He has  completed the procedure, still has the laparoscopes in her abdomen, and I am  doing the upper endoscopy to document patency of the anastomosis, make sure  there is no bleeding, and make sure there is no leak.   A flexible Olympus endoscope was passed down the back of the throat into the  esophagus without difficulty.  The patient's distal esophagus was noted to  be a little bit tortuous consistent with possibly a previous small hiatal  hernial.  In getting to the gastric pouch, I identified the anastomosis at  about 42 cm.  The GE junction was at about 37 cm, making the pouch about 5-6  cm in length.  The anastomosis was widely patent and there was no bleeding;  however, on insufflating the stomach there was a small amount of bubbling  along the lesser curvature at the gastrojejunal anastomosis.   At this point Dr. Johna Sheriff did a running suture to cover this part of the  anastomosis.  I then re-endoscoped the  patient and again under insufflation  while Dr. Johna Sheriff flooded the upper abdomen with saline, I saw no leak at  this time.  Again, the anastomosis was widely patent and I was able to get  the tip of my endoscope through the anastomosis.  There was no bleeding.  Again the GE junction was visualized at about 36-37 cm and the esophagus  itself was unremarkable.  I then pulled the endoscope out.                                               Sandria Bales. Ezzard Standing, M.D.    DHN/MEDQ  D:  08/20/2003  T:  08/20/2003  Job:  [id number]   cc:   Sharlet Salina T. Hoxworth, M.D.  [address]  [place]  [place] [address]  Fax: [phone]

## 2010-07-24 NOTE — Op Note (Signed)
NAME:  Miranda Hernandez, Miranda Hernandez                        ACCOUNT NO.:  [id number]   MEDICAL RECORD NO.:  [id number]                   PATIENT TYPE:  INP   LOCATION:  0001                                 FACILITY:  [facility]   PHYSICIAN:  Sharlet Salina T. Hoxworth, M.D.          DATE OF BIRTH:  Oct 24, 1960   DATE OF PROCEDURE:  08/20/2003  DATE OF DISCHARGE:                                 OPERATIVE REPORT   PREOPERATIVE DIAGNOSES:  1. Morbid obesity.  2. Cholelithiasis.   POSTOPERATIVE DIAGNOSES:  1. Morbid obesity.  2. Cholelithiasis.   OPERATION/PROCEDURE:  1. Laparoscopic Roux-en-Y gastric bypass.  2. Laparoscopic cholecystectomy and intraoperative cholangiogram.   SURGEON:  Sharlet Salina T. Hoxworth, M.D.   ASSISTANT:  Sandria Bales. Ezzard Standing, M.D.   ANESTHESIA:  General.   BRIEF HISTORY:  Miranda Hernandez is a 50 year old female with a lifelong  history of morbid obesity unresponsive to medical management.  She has co-  morbidities of hypertension and adult onset diabetes mellitus.  After  extensive discussion of options, we elected to proceed with surgical  correction of her obesity with laparoscopic Roux-en-Y gastric bypass.  The  options for treatment, outcomes and risks were discussed extensively and are  detailed elsewhere.  Preoperative work-up also indicated cholelithiasis and  we recommended proceeding with laparoscopic cholecystectomy and  cholangiogram at the same time.  Indications for the procedure, risks of  bleeding, infection, bile leaks, and __________were discussed.  She is now  brought to the operating room for this procedure.   DESCRIPTION OF PROCEDURE:  The patient was brought to the operating room and  placed in the supine position on the operating table and general  endotracheal anesthesia was induced.  She received a preoperative bowel prep  and preoperative IV antibiotics.  Lovenox 40 mg had been given  subcutaneously.  PAS were in place.  The abdomen was widely sterilely  prepped and draped.  Local anesthesia was used to infiltrate the trocar  sites prior to the incisions.  The abdomen was accessed with an 11 mm  Optiview trocar in the left subcostal space without difficulty and  pneumoperitoneum established.  Under direct vision, 11 mm trocars were  placed in the right perixiphoid area through the falciform ligament and in  the right and left upper abdomen and a 5 mm trocar placed at the left flank.  There were some omental adhesions from previous cesarean sections that were  taken down from abdominal wall with Harmonic scalpel without difficulty.  The omentum and transverse colon were then elevated and the ligament of  Treitz identified and a 40 cm afferent limb measured.  This reached easily  up toward the liver and the small bowel was divided at this point with a  simple firing of the endo-GIA vascular load stapler.  The mesentery was  further divided with the Harmonic scalpel down to the first cascade which  provided plenty of mobility.  Both bowel ends pink and healthy.  The  efferent limb was marked by suturing a Penrose drain to it.  A 100 cm  efferent limb was then measured.  At this point a jejunojejunostomy was  created, making enterotomies with the Harmonic scalpel and using endo-GIA  vascular 45 mm staple for the anastomosis.  The common enterotomy was then  closed with running 2-0 Vicryl beginning at either end of the enterotomy and  tied centrally.  The mesentery defect was then closed with a running 2-0  silk  suture, begun at the apex of the mesentery and carried out onto the  jejunojejunostomy.  The suture lines were then coated with Tisseel tissue  sealant.   Attention was then turned to the upper abdomen.  A Nathenson retractor was  placed through a 5 mm site just to the left of the xiphoid and the left lobe  of the liver elevated with good exposure of the hiatus and the stomach.  The  angle of His was mobilized, dividing peritoneum with  the Harmonic scalpel  and the stomach was mobilized up off the left crus.  All tubes were then  removed from the stomach.  A point of division of the stomach 5 cm from the  EG junction was chosen and peritoneum was sized along the lesser curve with  the Harmonic scalpel.  Dissection was then carried back toward the lesser  sac bluntly and with the Harmonic scalpel at right angles.  Initial firing  of the endo-GIA blue-load stapler was performed at right angles at this  point to the lesser curve.  Further dissection up toward the angle of His  entered the free lesser sac without difficulty and three more firings of the  endo-GIA blue-load stapler were used to create a nice small, tubular gastric  pouch with the Ewald tube being through the EG junction on the last couple  of firings.  The stomach seemed to be completely divided at the angle of  His.  The upper staple line on the pouch was treated with Tisseel tissue  sealant.  The efferent limb was then brought up to the pouch with no  tension.  Initial posterior row of running 0 Vicryl was placed between the  efferent jejunal limb and the gastric pouch.  An anastomosis was then  created making enterotomies in the gastric pouch and efferent limb with the  Harmonic scalpel and using 45 mm linear blue-load stapler.  On firing the  stapler, the tissue seemed to push away from the stapler just slightly.  The  staple line was inspected which was intact, but appeared just a little bit  short which possibly could create a somewhat small anastomosis.  Therefore,  a second firing of the stapler was used to lengthen the anastomosis  somewhat.  Again, the tissue seemed to push away a little bit but the staple  line was extended about another 0.5-1 cm which appeared adequate and the  staple line was intact and without bleeding.  The common enterotomy was then closed with running 2-0 Vicryl begun at either end of the enterotomy and  tied centrally.  The  Ewald tube was then passed through the anastomosis  without difficulty and an anterior row of running 2-0 Vicryl seromuscular  stitch was placed between the jejunal efferent limb of the stomach.   At this point with the efferent limb clamped, just beyond the anastomosis,  the patient was endoscoped and with the stomach tightly distended under  saline, at one point there appeared to  be a small trickle of bubbles coming  possibly from the suture line inferiorly near the lesser curve.  These did  not continue with the stomach distended and possibly could have represented  some air bubbles that had been injected with the irrigator.  However, this  was of some concern of course, and, therefore, the endoscope was withdrawn  and the anastomosis was carefully inspected.  We did not see any defects and  was carefully inspected posteriorly and anteriorly.  I did, however, use an  additional 2-0 silk running seromuscular suture beginning actually somewhat  posterior along the anastomosis and then coming up around the corner at the  lesser curve/jejunal junction, back up onto the anterior anastomosis which  covered the area where the few bubbles seemed to be seen.  At this point,  the efferent limb was again clamped.  Just distal to the anastomosis, the  patient was reendoscoped and stomach tightly distended.  There was no  evidence of any air bubbles whatsoever.  The anastomosis appeared patent,  slightly smaller than the average, probably about 15 mm but easily allowed  the endoscope to pass into the efferent limb.  Following this, the endoscope  was removed and all air suctioned.  Tisseel tissue sealant was placed over  the suture and staple lines.  Closed suction drain was left below the left  lobe of the liver near the anastomosis and brought out through the lateral  trocar site.   Attention was then turned to the gallbladder.  The trocar in the perixiphoid  area was redirected through the same  incision to the right side of the  falciform ligament and to additional 5 mm trocars were placed under direct  vision in the right flank.  The gallbladder had some significant omental  adhesions to it which were taken down with the Harmonic scalpel.  The fundus  was grasped and elevated over the liver and infundibulum retracted  inferolaterally.  Fibrofatty tissue was stripped off the neck of the  gallbladder toward the porta hepatis.  Peritoneum anterior and posterior to  Calot's triangle was incised.  The distal gallbladder was thoroughly  dissected, the cystic duct and cystic artery were identified.  The cystic  duct junction with the gallbladder dissected 360 degrees and the cystic duct  dissected out over about 1 cm.  When the anatomy was clear, the cystic  artery and cystic duct were clipped and operative cholangiograms were obtained through the cystic duct.  This showed good filling of normal common  bile duct and intrahepatic ducts, free flow into the duodenum and no filling  defects.  Following this, the cholangiocath was removed.  The cystic duct  was doubly clipped and proximally divided.  The cystic artery was further  clipped and divided.  Gallbladder was then dissected free from its bed using  cautery and Harmonic scalpel.  Complete hemostasis was obtained of the  gallbladder bed.  The gallbladder was placed in the EndoCatch bag and  brought up through one of the trocar sites where a large stone was extracted  and the gallbladder removed.   The abdomen was again inspected for hemostasis and appeared complete.  All  CO2 was evacuated and trocars removed.  The skin incisions were closed with  staples.  Sponge, needle and instrument counts were correct.  Dry sterile  dressings were applied and the patient taken to recovery in good condition.  Lorne Skeens. Hoxworth, M.D.    Tory Emerald  D:  08/20/2003  T:  08/20/2003  Job:  [id number]

## 2010-11-26 LAB — CBC
HCT: 25.2 — ABNORMAL LOW
Hemoglobin: 8.2 — ABNORMAL LOW
MCHC: 32.7
MCV: 69.2 — ABNORMAL LOW
Platelets: 163
RBC: 3.64 — ABNORMAL LOW
RDW: 20.7 — ABNORMAL HIGH
WBC: 10.3

## 2010-12-11 LAB — TYPE AND SCREEN
ABO/RH(D): A POS
Antibody Screen: NEGATIVE

## 2010-12-11 LAB — URINALYSIS, ROUTINE W REFLEX MICROSCOPIC
Bilirubin Urine: NEGATIVE
Glucose, UA: NEGATIVE
Hgb urine dipstick: NEGATIVE
Ketones, ur: NEGATIVE
Nitrite: NEGATIVE
Protein, ur: NEGATIVE
Specific Gravity, Urine: 1.02
Urobilinogen, UA: 1
pH: 6

## 2010-12-11 LAB — BASIC METABOLIC PANEL
BUN: 5 — ABNORMAL LOW
CO2: 25
Calcium: 8.7
Chloride: 104
Creatinine, Ser: 0.65
GFR calc Af Amer: >60
GFR calc non Af Amer: >60
Glucose, Bld: 90
Potassium: 3.6
Sodium: 135

## 2010-12-11 LAB — CBC
HCT: 24.4 — ABNORMAL LOW
Hemoglobin: 7.6 — CL
MCHC: 31
MCV: 66 — ABNORMAL LOW
Platelets: 274
RBC: 3.7 — ABNORMAL LOW
RDW: 17.1 — ABNORMAL HIGH
WBC: 6.9

## 2010-12-11 LAB — PREGNANCY, URINE: Preg Test, Ur: NEGATIVE

## 2010-12-11 LAB — ABO/RH: ABO/RH(D): A POS

## 2010-12-11 LAB — HEMOGLOBIN AND HEMATOCRIT, BLOOD
HCT: 20.1 — ABNORMAL LOW
Hemoglobin: 6.4 — CL

## 2010-12-18 LAB — I-STAT 8, (EC8 V) (CONVERTED LAB)
Acid-base deficit: 2
BUN: 9
Bicarbonate: 23.9
Chloride: 108
Glucose, Bld: 66 — ABNORMAL LOW
HCT: 25 — ABNORMAL LOW
Hemoglobin: 8.5 — ABNORMAL LOW
Operator id: 239701
Potassium: 3.5
Sodium: 141
TCO2: 25
pCO2, Ven: 44 — ABNORMAL LOW
pH, Ven: 7.342 — ABNORMAL HIGH

## 2010-12-18 LAB — POCT I-STAT CREATININE
Creatinine, Ser: 0.8
Operator id: 239701

## 2011-09-13 ENCOUNTER — Other Ambulatory Visit: Payer: Self-pay | Admitting: Obstetrics and Gynecology

## 2011-09-13 DIAGNOSIS — Z1231 Encounter for screening mammogram for malignant neoplasm of breast: Secondary | ICD-10-CM

## 2011-09-30 ENCOUNTER — Encounter: Payer: Self-pay | Admitting: Obstetrics and Gynecology

## 2011-09-30 ENCOUNTER — Ambulatory Visit (INDEPENDENT_AMBULATORY_CARE_PROVIDER_SITE_OTHER): Payer: 59 | Admitting: Obstetrics and Gynecology

## 2011-09-30 VITALS — BP 120/68 | Ht 65.0 in | Wt 222.0 lb

## 2011-09-30 DIAGNOSIS — R87613 High grade squamous intraepithelial lesion on cytologic smear of cervix (HGSIL): Secondary | ICD-10-CM

## 2011-09-30 DIAGNOSIS — Z01419 Encounter for gynecological examination (general) (routine) without abnormal findings: Secondary | ICD-10-CM

## 2011-09-30 DIAGNOSIS — Z124 Encounter for screening for malignant neoplasm of cervix: Secondary | ICD-10-CM

## 2011-09-30 DIAGNOSIS — Z90710 Acquired absence of both cervix and uterus: Secondary | ICD-10-CM

## 2011-09-30 DIAGNOSIS — I10 Essential (primary) hypertension: Secondary | ICD-10-CM | POA: Insufficient documentation

## 2011-09-30 DIAGNOSIS — E669 Obesity, unspecified: Secondary | ICD-10-CM | POA: Insufficient documentation

## 2011-09-30 DIAGNOSIS — IMO0002 Reserved for concepts with insufficient information to code with codable children: Secondary | ICD-10-CM

## 2011-09-30 NOTE — Progress Notes (Signed)
Last Pap: 2011 WNL: Yes Regular Periods:no Contraception: Hysterectomy  Monthly Breast exam:no Tetanus<48yrs:yes Nl.Bladder Function:yes Daily BMs:yes Healthy Diet:yes Calcium:no Mammogram:yes Date of Mammogram: 2011 one scheduled for tomorrow Exercise:yes Have often Exercise: twice weekly Seatbelt: yes Abuse at home: no Stressful work:no Sigmoid-colonoscopy: n/a Bone Density: No PCP: Elby Showers Change in PMH: None Change in [facility]: None  Subjective:    Miranda Hernandez is a 51 y.o. female G3P0010 who presents for annual exam. The patient complains of stress at home. She is newly married.  Her daughter in law is 62 years of age.  Her mother-in-law has moved into their home. The patient has a history of a high-grade squamous lesion on her cervix.  She is status post vaginal hysterectomy. Her younger sister died recently from a heart attack or pulmonary embolus.  The following portions of the patient's history were reviewed and updated as appropriate: allergies, current medications, past family history, past medical history, past social history, past surgical history and problem list. See above.  Review of Systems Pertinent items are noted in HPI. Gastrointestinal:No change in bowel habits, no abdominal pain, no rectal bleeding Genitourinary:negative for dysuria, frequency, hematuria, nocturia and urinary incontinence    Objective:     BP 120/68  Ht 5\' 5"  (1.651 m)  Wt 222 lb (100.699 kg)  BMI 36.94 kg/m2  Weight:  Wt Readings from Last 1 Encounters:  09/30/11 222 lb (100.699 kg)     BMI: Body mass index is 36.94 kg/(m^2). General Appearance: Alert, appropriate appearance for age. No acute distress HEENT: Grossly normal Neck / Thyroid: Supple, no masses, nodes or enlargement Lungs: clear to auscultation bilaterally Back: No CVA tenderness Breast Exam: No masses or nodes.No dimpling, nipple retraction or discharge. Cardiovascular: Regular rate and rhythm. S1, S2, no  murmur Gastrointestinal: Soft, non-tender, no masses or organomegaly  ++++++++++++++++++++++++++++++++++++++++++++++++++++++++  Pelvic Exam: External genitalia: normal general appearance Vaginal: normal without tenderness, induration or masses Cervix: absent Adnexa: normal bimanual exam Uterus: absent Rectovaginal: normal rectal, no masses  ++++++++++++++++++++++++++++++++++++++++++++++++++++++++  Lymphatic Exam: Non-palpable nodes in neck, clavicular, axillary, or inguinal regions  Psychiatric: Alert and oriented, appropriate affect.    Urinalysis:Not done      Assessment:    Normal gyn exam   Overweight or obese: Yes  Pelvic relaxation: No  Menopausal symptoms: No. Severe: No.  Status post vaginal hysterectomy for a high grade squamous intraepithelial lesion on the cervix.  Stress at home.   Plan:    Pap smear.   Stress discussed.  Counseling offered.  Follow-up:  for annual exam  STD screen request: none,  RPR: No,  HBsAg: No.  Hepatitis C: No.  The updated Pap smear screening guidelines were discussed with the patient. The patient requested that I obtain a Pap smear: Yes.  Kegel exercises discussed: Yes.  Proper diet and regular exercise were reviewed.  Annual mammograms recommended starting at age 2. Proper breast care was discussed.  Screening colonoscopy is recommended beginning at age 15.  Regular health maintenance was reviewed.  Sleep hygiene was discussed.  Adequate calcium and vitamin D intake was emphasized.  Mylinda Latina.D.

## 2011-10-01 ENCOUNTER — Ambulatory Visit (HOSPITAL_COMMUNITY)
Admission: RE | Admit: 2011-10-01 | Discharge: 2011-10-01 | Disposition: A | Discharge status: Home / Self Care | Payer: 59 | Source: Ambulatory Visit | Attending: Obstetrics and Gynecology | Admitting: Obstetrics and Gynecology

## 2011-10-01 DIAGNOSIS — Z1231 Encounter for screening mammogram for malignant neoplasm of breast: Secondary | ICD-10-CM | POA: Insufficient documentation

## 2011-10-01 LAB — PAP IG W/ RFLX HPV ASCU

## 2011-10-18 ENCOUNTER — Encounter: Payer: Self-pay | Admitting: Obstetrics and Gynecology

## 2012-12-13 ENCOUNTER — Encounter: Payer: Self-pay | Admitting: Internal Medicine

## 2013-01-10 ENCOUNTER — Other Ambulatory Visit: Payer: Self-pay | Admitting: Obstetrics and Gynecology

## 2013-01-10 DIAGNOSIS — Z1231 Encounter for screening mammogram for malignant neoplasm of breast: Secondary | ICD-10-CM

## 2013-01-11 ENCOUNTER — Ambulatory Visit (HOSPITAL_COMMUNITY)
Admission: RE | Admit: 2013-01-11 | Discharge: 2013-01-11 | Disposition: A | Discharge status: Home / Self Care | Payer: 59 | Source: Ambulatory Visit | Attending: Obstetrics and Gynecology | Admitting: Obstetrics and Gynecology

## 2013-01-11 DIAGNOSIS — Z1231 Encounter for screening mammogram for malignant neoplasm of breast: Secondary | ICD-10-CM | POA: Insufficient documentation

## 2013-01-13 ENCOUNTER — Encounter: Payer: Self-pay | Admitting: Interventional Cardiology

## 2013-01-13 ENCOUNTER — Encounter: Payer: Self-pay | Admitting: *Deleted

## 2013-01-17 ENCOUNTER — Ambulatory Visit (INDEPENDENT_AMBULATORY_CARE_PROVIDER_SITE_OTHER): Payer: 59 | Admitting: Interventional Cardiology

## 2013-01-17 ENCOUNTER — Encounter: Payer: Self-pay | Admitting: Interventional Cardiology

## 2013-01-17 VITALS — BP 130/72 | HR 68 | Ht 65.0 in | Wt <= 1120 oz

## 2013-01-17 DIAGNOSIS — I10 Essential (primary) hypertension: Secondary | ICD-10-CM

## 2013-01-17 DIAGNOSIS — E785 Hyperlipidemia, unspecified: Secondary | ICD-10-CM

## 2013-01-17 DIAGNOSIS — E669 Obesity, unspecified: Secondary | ICD-10-CM

## 2013-01-17 NOTE — Progress Notes (Signed)
Patient ID: Miranda Hernandez, female   DOB: 24-Jun-1960, 52 y.o.   MRN: [id number]    [address], [address] [place], [place]  [address] Phone: [phone] Fax:  [phone]  Date:  01/17/2013   ID:  Miranda Hernandez, DOB October 21, 1960, MRN [id number]  PCP:  Elby Showers, MD   ASSESSMENT:  1. Hypertension under good control 2. Obesity, increasing 3. Hyperlipidemia on therapy  PLAN:  1. I have indicated an active lifestyle, caloric restrictions, and requested that she have laboratory work done by her primary physician she sees her within the next 30 days.   SUBJECTIVE: Miranda Hernandez is a 52 y.o. female who has no cardiac complaints. No medication side effects. She denies chest pain. No peripheral edema   Wt Readings from Last 3 Encounters:  01/17/13 22 lb (9.979 kg)  09/30/11 222 lb (100.699 kg)     Past Medical History  Diagnosis Date  . Anemia   . Hypercholesteremia   . HTN (hypertension)   . Obesity     Current Outpatient Prescriptions  Medication Sig Dispense Refill  . amLODipine-benazepril (LOTREL) 10-40 MG per capsule Take 1 capsule by mouth daily.      . hydrochlorothiazide (HYDRODIURIL) 25 MG tablet Take 25 mg by mouth daily.      . metoprolol (LOPRESSOR) 100 MG tablet Take 150 mg daily       No current facility-administered medications for this visit.    Allergies:   No Known Allergies  Social History:  The patient  reports that she has never smoked. She has never used smokeless tobacco. She reports that she does not drink alcohol or use illicit drugs.   ROS:  Please see the history of present illness.     All other systems reviewed and negative.   OBJECTIVE: VS:  BP 130/72  Pulse 68  Ht 5\' 5"  (1.651 m)  Wt 22 lb (9.979 kg)  BMI 3.66 kg/m2  SpO2 97% Well nourished, well developed, in no acute distress, mildly obese HEENT: normal Neck: JVD flat. Carotid bruit absent Cardiac:  normal S1, S2; RRR; no murmur Lungs:  clear to auscultation  bilaterally, no wheezing, rhonchi or rales Abd: soft, nontender, no hepatomegaly Ext: Edema absent. Pulses 2+ Skin: warm and dry Neuro:  CNs 2-12 intact, no focal abnormalities noted  EKG:  Normal sinus rhythm with nonspecific T wave flattening and prominent voltage. No significant change from prior tracings       Signed, Darci Needle III, MD 01/17/2013 6:29 PM

## 2013-01-17 NOTE — Patient Instructions (Signed)
Your physician recommends that you continue on your current medications as directed. Please refer to the Current Medication list given to you today.  Your physician wants you to follow-up in: 1 year You will receive a reminder letter in the mail two months in advance. If you don't receive a letter, please call our office to schedule the follow-up appointment.  Continue to maintain an healthy lifestyle.

## 2013-02-12 ENCOUNTER — Ambulatory Visit (AMBULATORY_SURGERY_CENTER): Payer: Self-pay | Admitting: *Deleted

## 2013-02-12 VITALS — Ht 66.0 in | Wt 226.2 lb

## 2013-02-12 DIAGNOSIS — Z1211 Encounter for screening for malignant neoplasm of colon: Secondary | ICD-10-CM

## 2013-02-12 MED ORDER — MOVIPREP 100 G PO SOLR: ORAL | Status: DC

## 2013-02-12 NOTE — Progress Notes (Signed)
Patient denies any allergies to eggs or soy. 

## 2013-02-26 ENCOUNTER — Ambulatory Visit (AMBULATORY_SURGERY_CENTER): Payer: 59 | Admitting: Internal Medicine

## 2013-02-26 ENCOUNTER — Encounter: Payer: Self-pay | Admitting: Internal Medicine

## 2013-02-26 VITALS — BP 142/92 | HR 51 | Temp 97.0°F | Resp 19 | Ht 66.0 in | Wt 226.0 lb

## 2013-02-26 DIAGNOSIS — Z1211 Encounter for screening for malignant neoplasm of colon: Secondary | ICD-10-CM

## 2013-02-26 MED ORDER — SODIUM CHLORIDE 0.9 % IV SOLN: 500.0000 mL | INTRAVENOUS | Status: DC

## 2013-02-26 NOTE — Op Note (Signed)
[facility] [address] [place], [address]   COLONOSCOPY PROCEDURE REPORT  PATIENT: Miranda, Hernandez  MR#: [id number] BIRTHDATE: 26-Dec-1960 , 52  yrs. old GENDER: Female ENDOSCOPIST: Hart Carwin, MD  , REFERRED XB:[id number] Clent Ridges, M.D. A.Stringer MD PROCEDURE DATE:  02/26/2013 PROCEDURE:   Colonoscopy, screening First Screening Colonoscopy - Avg.  risk and is 50 yrs.  old or older Yes.  Prior Negative Screening - Now for repeat screening. N/A  History of Adenoma - Now for follow-up colonoscopy & has been > or = to 3 yrs.  N/A  Polyps Removed Today? No.  Recommend repeat exam, <10 yrs? No. ASA CLASS:   Class II INDICATIONS:Average risk patient for colon cancer. MEDICATIONS: MAC sedation, administered by CRNA and propofol (Diprivan) 200mg  IV  DESCRIPTION OF PROCEDURE:   After the risks benefits and alternatives of the procedure were thoroughly explained, informed consent was obtained.  A digital rectal exam revealed no abnormalities of the rectum.   The LB [id number]-[id number]  endoscope was introduced through the anus and advanced to the cecum, which was identified by both the appendix and ileocecal valve. No adverse events experienced.   The quality of the prep was excellent, using MoviPrep  The instrument was then slowly withdrawn as the colon was fully examined.      COLON FINDINGS: A normal appearing cecum, ileocecal valve, and appendiceal orifice were identified.  The ascending, hepatic flexure, transverse, splenic flexure, descending, sigmoid colon and rectum appeared unremarkable.  No polyps or cancers were seen. Retroflexed views revealed no abnormalities. The time to cecum=4 minutes 55 seconds.  Withdrawal time=6 minutes 05 seconds.  The scope was withdrawn and the procedure completed. COMPLICATIONS: There were no complications.  ENDOSCOPIC IMPRESSION: Normal colon  RECOMMENDATIONS: high fiber diet Recall colonoscopy in 10  years   eSigned:  Hart Carwin, MD 02/26/2013 9:43 AM   cc:

## 2013-02-26 NOTE — Progress Notes (Signed)
Procedure ends, to recovery, report given and VSS. 

## 2013-02-26 NOTE — Patient Instructions (Signed)
YOU HAD AN ENDOSCOPIC PROCEDURE TODAY AT THE [facility]: Refer to the procedure report that was given to you for any specific questions about what was found during the examination.  If the procedure report does not answer your questions, please call your gastroenterologist to clarify.  If you requested that your care partner not be given the details of your procedure findings, then the procedure report has been included in a sealed envelope for you to review at your convenience later.  YOU SHOULD EXPECT: Some feelings of bloating in the abdomen. Passage of more gas than usual.  Walking can help get rid of the air that was put into your GI tract during the procedure and reduce the bloating. If you had a lower endoscopy (such as a colonoscopy or flexible sigmoidoscopy) you may notice spotting of blood in your stool or on the toilet paper. If you underwent a bowel prep for your procedure, then you may not have a normal bowel movement for a few days.  DIET: Your first meal following the procedure should be a light meal and then it is ok to progress to your normal diet.  A half-sandwich or bowl of soup is an example of a good first meal.  Heavy or fried foods are harder to digest and may make you feel nauseous or bloated.  Likewise meals heavy in dairy and vegetables can cause extra gas to form and this can also increase the bloating.  Drink plenty of fluids but you should avoid alcoholic beverages for 24 hours.  ACTIVITY: Your care partner should take you home directly after the procedure.  You should plan to take it easy, moving slowly for the rest of the day.  You can resume normal activity the day after the procedure however you should NOT DRIVE or use heavy machinery for 24 hours (because of the sedation medicines used during the test).    SYMPTOMS TO REPORT IMMEDIATELY: A gastroenterologist can be reached at any hour.  During normal business hours, 8:30 AM to 5:00 PM Monday through Friday,  call [phone].  After hours and on weekends, please call the GI answering service at [phone] who will take a message and have the physician on call contact you.   Following lower endoscopy (colonoscopy or flexible sigmoidoscopy):  Excessive amounts of blood in the stool  Significant tenderness or worsening of abdominal pains  Swelling of the abdomen that is new, acute  Fever of 100F or higher   FOLLOW UP: If any biopsies were taken you will be contacted by phone or by letter within the next 1-3 weeks.  Call your gastroenterologist if you have not heard about the biopsies in 3 weeks.  Our staff will call the home number listed on your records the next business day following your procedure to check on you and address any questions or concerns that you may have at that time regarding the information given to you following your procedure. This is a courtesy call and so if there is no answer at the home number and we have not heard from you through the emergency physician on call, we will assume that you have returned to your regular daily activities without incident.  SIGNATURES/CONFIDENTIALITY: You and/or your care partner have signed paperwork which will be entered into your electronic medical record.  These signatures attest to the fact that that the information above on your After Visit Summary has been reviewed and is understood.  Full responsibility of the confidentiality of   this discharge information lies with you and/or your care-partner.    A handout was given to your care partner on a high fiber diet with liberal fluid intake. You may resume your current medications today. Please call if any questions or concerns.    

## 2013-02-26 NOTE — Progress Notes (Signed)
No complaints noted in the recovery room. Maw   

## 2013-02-27 ENCOUNTER — Telehealth: Payer: Self-pay | Admitting: *Deleted

## 2013-02-27 NOTE — Telephone Encounter (Signed)
No answer, left message to call if questions or concerns. 

## 2013-05-02 ENCOUNTER — Other Ambulatory Visit: Payer: Self-pay | Admitting: Interventional Cardiology

## 2013-09-03 ENCOUNTER — Other Ambulatory Visit: Payer: Self-pay | Admitting: Interventional Cardiology

## 2014-01-16 ENCOUNTER — Encounter: Payer: Self-pay | Admitting: Interventional Cardiology

## 2014-01-18 ENCOUNTER — Ambulatory Visit: Payer: 59 | Admitting: Interventional Cardiology

## 2014-02-11 ENCOUNTER — Ambulatory Visit: Payer: 59 | Admitting: Interventional Cardiology

## 2014-03-18 ENCOUNTER — Ambulatory Visit: Payer: 59 | Admitting: Interventional Cardiology

## 2014-03-26 ENCOUNTER — Encounter: Payer: Self-pay | Admitting: Interventional Cardiology

## 2015-05-02 DIAGNOSIS — E119 Type 2 diabetes mellitus without complications: Secondary | ICD-10-CM | POA: Diagnosis not present

## 2015-05-05 MED FILL — HYDROCHLOROTHIAZIDE 25 MG T: 25 | 90 days supply | Qty: 90 | Fill #0

## 2015-05-05 MED FILL — METOPROLOL SUCC ER 100 MG T: 100 | 90 days supply | Qty: 135 | Fill #0

## 2015-05-20 MED FILL — METFORMIN HCL ER 1,000 MG T: 1000 | 90 days supply | Qty: 90 | Fill #1

## 2015-05-29 MED FILL — MOMETASONE FUROATE 50 MCG S: 50 | 90 days supply | Qty: 51 | Fill #0

## 2015-06-30 ENCOUNTER — Encounter: Payer: 59 | Attending: Internal Medicine | Admitting: Dietician

## 2015-06-30 ENCOUNTER — Encounter: Payer: Self-pay | Admitting: Dietician

## 2015-06-30 DIAGNOSIS — Z713 Dietary counseling and surveillance: Secondary | ICD-10-CM | POA: Diagnosis not present

## 2015-06-30 NOTE — Patient Instructions (Addendum)
-  Have a protein shake instead of skipping a meal -Keep exercising regularly  -BELT and walking in the evenings -Continue keeping trigger foods out of the house -Resume taking a multivitamin (Flintstones chewable) -Stick with lean meats and vegetables most of the time -Have a protein with each meal and snack  -Focus on eating slowly and mindfully

## 2015-06-30 NOTE — Progress Notes (Signed)
  Medical Nutrition Therapy:  Appt start time: 215 end time:  335.  Assessment:  Primary concerns today: Britta MccreedyBarbara is here today to discuss weight gain. She had gastric bypass in 2006. Recently resumed Metformin in the last year. Joined Weight Wat[employer] recently for the 3rd time and feels like it is not helpful. Recently resumed BELT program and has been working out 3x a week. Reached lowest weight of 172 lbs within a year after gastric bypass surgery. Highest pre op weight about 272 lbs. Began to regain weight about 3 years post op when she got married and relocated to an area where she was not able to be as active. She works as a Higher education careers adviserstaff nurse educator. She works from [employer]am-5pm. Her job is very sedentary but she tries to be as active as possible during work. She lives with her husband and son. Takes iron but is very inconsistent about multivitamin. Continues to avoid drinking while eating.   Goal: 200 lbs and reduced Metformin, feeling attractive and confident (sexuality and social)  TANITA  BODY COMP RESULTS  06/30/15   BMI (kg/m^2) 37.2   Fat Mass (lbs) 101.5   Fat Free Mass (lbs) 122   Total Body Water (lbs) 89.5     Preferred Learning Style:   No preference indicated   Learning Readiness:   Ready  MEDICATIONS: see list   DIETARY INTAKE:  Avoided foods include [place] yogurt, sodas, shellfish, beef.   Erratic meal pattern.  24-hr recall:  B ( AM): sometimes skips, boiled egg with [place] bacon; coffee with artificial sweetener and half and half  Snk ( AM):   L ( PM): small smoothie and an apple Snk ( PM):  D ( PM): tortilla with chicken and vegetables Snk ( PM):   Beverages: water (not enough per patient), protein shakes  Usual physical activity: BELT 3x a week, walking in the evenings  Estimated energy needs: 1200-1400 calories 135-158 g carbohydrates 90-105 g protein 33-39 g fat  Progress Towards Goal(s):  In progress.   Nutritional Diagnosis:  [place]-3.4 Unintentional  weight gain As related to history of sedentary lifestyle, excessive energy intake, and erratic meal pattern.  As evidenced by recent weight gain of 50 lbs per patient report.    Intervention:  Nutrition counseling provided.  Teaching Method Utilized:  Visual Auditory Hands on  Handouts given during visit include:  Pre op class  Barriers to learning/adherence to lifestyle change: none  Demonstrated degree of understanding via:  Teach Back   Monitoring/Evaluation:  Dietary intake, exercise, and body weight in 3 month(s).

## 2015-07-17 MED FILL — AMLODIPINE BESYLATE 10 MG T: 10 | 90 days supply | Qty: 90 | Fill #1

## 2015-07-17 MED FILL — BENAZEPRIL HCL 40 MG TABLET: 40 | 90 days supply | Qty: 90 | Fill #1

## 2015-09-02 MED FILL — METOPROLOL SUCC ER 100 MG T: 100 | 90 days supply | Qty: 135 | Fill #0

## 2015-09-02 MED FILL — METFORMIN HCL ER 1,000 MG T: 1000 | 90 days supply | Qty: 90 | Fill #2

## 2015-09-05 MED FILL — HYDROCHLOROTHIAZIDE 25 MG T: 25 | 90 days supply | Qty: 90 | Fill #0

## 2015-09-29 ENCOUNTER — Ambulatory Visit: Payer: 59 | Admitting: Dietician

## 2015-11-11 MED FILL — AMLODIPINE BESYLATE 10 MG T: 10 | 90 days supply | Qty: 90 | Fill #2

## 2015-11-11 MED FILL — BENAZEPRIL HCL 40 MG TABLET: 40 | 90 days supply | Qty: 90 | Fill #2

## 2016-01-06 DIAGNOSIS — I10 Essential (primary) hypertension: Secondary | ICD-10-CM | POA: Diagnosis not present

## 2016-01-06 DIAGNOSIS — Z1239 Encounter for other screening for malignant neoplasm of breast: Secondary | ICD-10-CM | POA: Diagnosis not present

## 2016-01-06 DIAGNOSIS — E1165 Type 2 diabetes mellitus with hyperglycemia: Secondary | ICD-10-CM | POA: Diagnosis not present

## 2016-01-06 DIAGNOSIS — Z6838 Body mass index (BMI) 38.0-38.9, adult: Secondary | ICD-10-CM | POA: Diagnosis not present

## 2016-01-06 DIAGNOSIS — E669 Obesity, unspecified: Secondary | ICD-10-CM | POA: Diagnosis not present

## 2016-01-06 DIAGNOSIS — Z0000 Encounter for general adult medical examination without abnormal findings: Secondary | ICD-10-CM | POA: Diagnosis not present

## 2016-01-06 DIAGNOSIS — E7800 Pure hypercholesterolemia, unspecified: Secondary | ICD-10-CM | POA: Diagnosis not present

## 2016-01-06 DIAGNOSIS — E119 Type 2 diabetes mellitus without complications: Secondary | ICD-10-CM | POA: Diagnosis not present

## 2016-01-06 DIAGNOSIS — Z1211 Encounter for screening for malignant neoplasm of colon: Secondary | ICD-10-CM | POA: Diagnosis not present

## 2016-01-14 MED FILL — METFORMIN HCL ER 1,000 MG T: 1000 | 90 days supply | Qty: 90 | Fill #3

## 2016-01-14 MED FILL — HYDROCHLOROTHIAZIDE 25 MG T: 25 | 90 days supply | Qty: 90 | Fill #0

## 2016-02-17 DIAGNOSIS — N958 Other specified menopausal and perimenopausal disorders: Secondary | ICD-10-CM | POA: Diagnosis not present

## 2016-02-17 DIAGNOSIS — Z780 Asymptomatic menopausal state: Secondary | ICD-10-CM | POA: Diagnosis not present

## 2016-02-18 MED FILL — METOPROLOL SUCC ER 100 MG T: 100 | 90 days supply | Qty: 135 | Fill #1

## 2016-03-12 MED FILL — AMLODIPINE BESYLATE 10 MG T: 10 | 90 days supply | Qty: 90 | Fill #0

## 2016-03-17 MED FILL — BENAZEPRIL HCL 40 MG TABLET: 40 | 90 days supply | Qty: 90 | Fill #0

## 2016-04-14 DIAGNOSIS — H524 Presbyopia: Secondary | ICD-10-CM | POA: Diagnosis not present

## 2016-04-14 DIAGNOSIS — H52223 Regular astigmatism, bilateral: Secondary | ICD-10-CM | POA: Diagnosis not present

## 2016-04-14 DIAGNOSIS — H5203 Hypermetropia, bilateral: Secondary | ICD-10-CM | POA: Diagnosis not present

## 2016-05-03 DIAGNOSIS — J301 Allergic rhinitis due to pollen: Secondary | ICD-10-CM | POA: Diagnosis not present

## 2016-05-03 DIAGNOSIS — M79672 Pain in left foot: Secondary | ICD-10-CM | POA: Diagnosis not present

## 2016-05-03 DIAGNOSIS — E119 Type 2 diabetes mellitus without complications: Secondary | ICD-10-CM | POA: Diagnosis not present

## 2016-05-03 DIAGNOSIS — Z7984 Long term (current) use of oral hypoglycemic drugs: Secondary | ICD-10-CM | POA: Diagnosis not present

## 2016-05-03 DIAGNOSIS — I10 Essential (primary) hypertension: Secondary | ICD-10-CM | POA: Diagnosis not present

## 2016-05-03 MED FILL — HYDROCHLOROTHIAZIDE 25 MG T: 25 | 90 days supply | Qty: 90 | Fill #1

## 2016-05-03 MED FILL — MOMETASONE FUROATE 50 MCG S: 50 | 90 days supply | Qty: 51 | Fill #1

## 2016-05-07 MED FILL — METFORMIN HCL ER 500 MG TAB: 500 | 90 days supply | Qty: 180 | Fill #0

## 2016-05-19 ENCOUNTER — Telehealth: Payer: Self-pay | Admitting: *Deleted

## 2016-05-19 ENCOUNTER — Encounter: Payer: Self-pay | Admitting: Podiatry

## 2016-05-19 ENCOUNTER — Ambulatory Visit (INDEPENDENT_AMBULATORY_CARE_PROVIDER_SITE_OTHER): Payer: 59

## 2016-05-19 ENCOUNTER — Ambulatory Visit (INDEPENDENT_AMBULATORY_CARE_PROVIDER_SITE_OTHER): Payer: 59 | Admitting: Podiatry

## 2016-05-19 DIAGNOSIS — M722 Plantar fascial fibromatosis: Secondary | ICD-10-CM

## 2016-05-19 DIAGNOSIS — M79672 Pain in left foot: Secondary | ICD-10-CM | POA: Diagnosis not present

## 2016-05-19 MED ORDER — IBUPROFEN 600 MG PO TABS: 600.0000 mg | ORAL_TABLET | Freq: Three times a day (TID) | ORAL | 0 refills | Status: AC | PRN

## 2016-05-19 MED FILL — IBUPROFEN 600 MG TABLET: 600 | 10 days supply | Qty: 30 | Fill #0

## 2016-05-19 NOTE — Telephone Encounter (Addendum)
-----   Message from Felecia ShellingBrent M Evans, DPM sent at 05/19/2016  9:31 AM EDT ----- Regarding: CT left foot Please order CT scan left foot.  Dx: possible calcaneal fracture left.   Thanks, Dr. Logan BoresEvans. Gave orders to [facility]

## 2016-05-19 NOTE — Telephone Encounter (Deleted)
-----   Message from Felecia ShellingBrent M Evans, DPM sent at 05/19/2016  9:31 AM EDT ----- Regarding: CT left foot Please order CT scan left foot.  Dx: possible calcaneal fracture left.   Thanks, Dr. Logan BoresEvans

## 2016-05-19 NOTE — Progress Notes (Signed)
   Subjective:  Patient presents today for evaluation of left heel pain is been going on for proximal he 6 months now. Patient states that she enjoys exercising but she hasn't been able to exercise lately because of the heel pain. Patient denies trauma. Patient is recently been taking naproxen to alleviate some symptoms.    Objective/Physical Exam General: The patient is alert and oriented x3 in no acute distress.  Dermatology: Skin is warm, dry and supple bilateral lower extremities. Negative for open lesions or macerations.  Vascular: Palpable pedal pulses bilaterally. No edema or erythema noted. Capillary refill within normal limits.  Neurological: Epicritic and protective threshold grossly intact bilaterally.   Musculoskeletal Exam: Today there does not appear to be significant pain on palpation to any anatomical region of the left foot.  Radiographic Exam:  Cortical step-off noted to the plantar aspect of the calcaneus on lateral view suspicious for possible stress fracture. There is a noted possible os trigonum to the posterior aspect of the ankle as well suspicious for possible Steida's process fracture  Assessment: #1 left heel pain 6 months #2 possible calcaneal fracture left   Plan of Care:  #1 Patient was evaluated. #2 today were going to order a CT scan to rule out calcaneal fracture. #3 return to clinic in 3 weeks to review CT results. #4 prescription for Motrin 600   Felecia ShellingBrent M. Evans, DPM [facility]  Dr. Felecia ShellingBrent M. Evans, DPM    [address]                                        [place], [place] [address]                Office [phone]  Fax [phone]

## 2016-05-26 DIAGNOSIS — Z1272 Encounter for screening for malignant neoplasm of vagina: Secondary | ICD-10-CM | POA: Diagnosis not present

## 2016-05-26 DIAGNOSIS — Z6839 Body mass index (BMI) 39.0-39.9, adult: Secondary | ICD-10-CM | POA: Diagnosis not present

## 2016-05-26 DIAGNOSIS — Z01411 Encounter for gynecological examination (general) (routine) with abnormal findings: Secondary | ICD-10-CM | POA: Diagnosis not present

## 2016-05-26 DIAGNOSIS — Z1231 Encounter for screening mammogram for malignant neoplasm of breast: Secondary | ICD-10-CM | POA: Diagnosis not present

## 2016-05-28 ENCOUNTER — Ambulatory Visit
Admission: RE | Admit: 2016-05-28 | Discharge: 2016-05-28 | Disposition: A | Discharge status: Home / Self Care | Payer: 59 | Source: Ambulatory Visit | Attending: Podiatry | Admitting: Podiatry

## 2016-05-28 DIAGNOSIS — M79672 Pain in left foot: Secondary | ICD-10-CM | POA: Diagnosis not present

## 2016-06-07 ENCOUNTER — Ambulatory Visit (INDEPENDENT_AMBULATORY_CARE_PROVIDER_SITE_OTHER): Payer: 59 | Admitting: Podiatry

## 2016-06-07 DIAGNOSIS — M79672 Pain in left foot: Secondary | ICD-10-CM | POA: Diagnosis not present

## 2016-06-07 DIAGNOSIS — M722 Plantar fascial fibromatosis: Secondary | ICD-10-CM | POA: Diagnosis not present

## 2016-06-07 NOTE — Progress Notes (Signed)
   Subjective:  Patient presents today for follow-up treatment and evaluation of left heel pain 6 months. CT results are also available for review. Patient states the pain continues to be intermittent. She is taking Motrin when necessary.    Objective/Physical Exam General: The patient is alert and oriented x3 in no acute distress.  Dermatology: Skin is warm, dry and supple bilateral lower extremities. Negative for open lesions or macerations.  Vascular: Palpable pedal pulses bilaterally. No edema or erythema noted. Capillary refill within normal limits.  Neurological: Epicritic and protective threshold grossly intact bilaterally.   Musculoskeletal Exam: Today there does not appear to be significant pain on palpation to any anatomical region of the left foot. CT findings are negative for acute osseous fracture.  Assessment: #1 left heel pain 6 months #2 possible calcaneal fracture left   Plan of Care:  #1 Patient was evaluated. #2 today were going to recommend daily stretching exercises and appropriate shoe gear. #3 return to clinic when necessary  Felecia Shelling, DPM [facility]  Dr. Felecia Shelling, DPM    [address]                                        [place], [place] [address]                Office [phone]  Fax [phone]

## 2016-06-15 MED FILL — METOPROLOL SUCC ER 100 MG T: 100 | 90 days supply | Qty: 135 | Fill #0

## 2016-07-21 MED FILL — BENAZEPRIL HCL 40 MG TABLET: 40 | 90 days supply | Qty: 90 | Fill #1

## 2016-07-21 MED FILL — AMLODIPINE BESYLATE 10 MG T: 10 | 90 days supply | Qty: 90 | Fill #1

## 2016-08-17 MED FILL — METFORMIN HCL ER 500 MG TAB: 500 | 90 days supply | Qty: 360 | Fill #0

## 2016-09-23 MED FILL — HYDROCHLOROTHIAZIDE 25 MG T: 25 | 90 days supply | Qty: 90 | Fill #0

## 2016-10-26 MED FILL — METOPROLOL SUCC ER 100 MG T: 100 | 90 days supply | Qty: 135 | Fill #1

## 2016-11-03 DIAGNOSIS — E119 Type 2 diabetes mellitus without complications: Secondary | ICD-10-CM | POA: Diagnosis not present

## 2016-11-03 DIAGNOSIS — Z6838 Body mass index (BMI) 38.0-38.9, adult: Secondary | ICD-10-CM | POA: Diagnosis not present

## 2016-11-03 DIAGNOSIS — Z7984 Long term (current) use of oral hypoglycemic drugs: Secondary | ICD-10-CM | POA: Diagnosis not present

## 2016-11-03 DIAGNOSIS — E1165 Type 2 diabetes mellitus with hyperglycemia: Secondary | ICD-10-CM | POA: Diagnosis not present

## 2016-11-03 DIAGNOSIS — E669 Obesity, unspecified: Secondary | ICD-10-CM | POA: Diagnosis not present

## 2016-11-04 MED FILL — FREESTYLE LITE TEST STRIP: 90 days supply | Qty: 100 | Fill #0

## 2016-11-04 MED FILL — AMLODIPINE BESYLATE 10 MG T: 10 | 90 days supply | Qty: 90 | Fill #0

## 2016-11-05 MED FILL — VICTOZA 18 MG/3 ML INJECT P: 18 | 19 days supply | Qty: 3 | Fill #0

## 2016-11-30 MED FILL — BENAZEPRIL HCL 40 MG TABLET: 40 | 90 days supply | Qty: 90 | Fill #2

## 2016-12-14 MED FILL — VICTOZA 18 MG/3 ML INJECT P: 18 | 19 days supply | Qty: 3 | Fill #1

## 2017-01-10 MED FILL — VICTOZA 18 MG/3 ML INJECT P: 18 | 30 days supply | Qty: 6 | Fill #0

## 2017-02-02 DIAGNOSIS — E669 Obesity, unspecified: Secondary | ICD-10-CM | POA: Diagnosis not present

## 2017-02-02 DIAGNOSIS — J301 Allergic rhinitis due to pollen: Secondary | ICD-10-CM | POA: Diagnosis not present

## 2017-02-02 DIAGNOSIS — Z0000 Encounter for general adult medical examination without abnormal findings: Secondary | ICD-10-CM | POA: Diagnosis not present

## 2017-02-02 DIAGNOSIS — Z1231 Encounter for screening mammogram for malignant neoplasm of breast: Secondary | ICD-10-CM | POA: Diagnosis not present

## 2017-02-02 DIAGNOSIS — E119 Type 2 diabetes mellitus without complications: Secondary | ICD-10-CM | POA: Diagnosis not present

## 2017-02-02 DIAGNOSIS — I10 Essential (primary) hypertension: Secondary | ICD-10-CM | POA: Diagnosis not present

## 2017-02-02 DIAGNOSIS — Z6838 Body mass index (BMI) 38.0-38.9, adult: Secondary | ICD-10-CM | POA: Diagnosis not present

## 2017-02-09 MED FILL — FREESTYLE LITE TEST STRIP: 90 days supply | Qty: 100 | Fill #0

## 2017-02-09 MED FILL — AMLODIPINE BESYLATE 10 MG T: 10 | 90 days supply | Qty: 90 | Fill #0

## 2017-02-09 MED FILL — HYDROCHLOROTHIAZIDE 25 MG T: 25 | 90 days supply | Qty: 90 | Fill #0

## 2017-02-09 MED FILL — METFORMIN HCL ER 500 MG TAB: 500 | 90 days supply | Qty: 180 | Fill #0

## 2017-02-09 MED FILL — VICTOZA 18 MG/3 ML INJECT P: 18 | 30 days supply | Qty: 6 | Fill #0

## 2017-02-09 MED FILL — FREESTYLE LANCETS: 90 days supply | Qty: 100 | Fill #0

## 2017-02-09 MED FILL — METOPROLOL SUCC ER 100 MG T: 100 | 90 days supply | Qty: 135 | Fill #0

## 2017-02-21 DIAGNOSIS — E119 Type 2 diabetes mellitus without complications: Secondary | ICD-10-CM | POA: Diagnosis not present

## 2017-04-13 MED FILL — BENAZEPRIL HCL 40 MG TABLET: 40 | 90 days supply | Qty: 90 | Fill #0

## 2017-04-18 MED FILL — VICTOZA 18 MG/3 ML INJECT P: 18 | 30 days supply | Qty: 6 | Fill #1

## 2017-05-30 MED FILL — VICTOZA 18 MG/3 ML INJECT P: 18 | 30 days supply | Qty: 9 | Fill #0

## 2017-06-21 MED FILL — HYDROCHLOROTHIAZIDE 25 MG T: 25 | 90 days supply | Qty: 90 | Fill #1

## 2017-07-12 MED FILL — METOPROLOL SUCCINATE ER 100: 100 | 90 days supply | Qty: 135 | Fill #1

## 2017-08-03 MED FILL — VICTOZA 18 MG/3 ML INJECT P: 18 | 30 days supply | Qty: 9 | Fill #1

## 2017-08-22 MED FILL — AMLODIPINE BESYLATE 10 MG T: 10 | 90 days supply | Qty: 90 | Fill #1

## 2017-08-22 MED FILL — BENAZEPRIL HCL 40 MG TABLET: 40 | 90 days supply | Qty: 90 | Fill #1

## 2017-09-12 MED FILL — METFORMIN HCL ER 500 MG TAB: 500 | 90 days supply | Qty: 180 | Fill #1

## 2017-10-05 MED FILL — TRULICITY 0.75 MG/0.5 ML PE: 0.75 | 28 days supply | Qty: 2 | Fill #0

## 2017-11-01 MED FILL — HYDROCHLOROTHIAZIDE 25 MG T: 25 | 90 days supply | Qty: 90 | Fill #0

## 2017-11-17 MED FILL — METOPROLOL SUCCINATE ER 100: 100 | 90 days supply | Qty: 135 | Fill #0

## 2017-12-19 MED FILL — TRULICITY 0.75 MG/0.5 ML PE: 0.75 | 28 days supply | Qty: 2 | Fill #1

## 2017-12-21 ENCOUNTER — Encounter (HOSPITAL_COMMUNITY): Payer: Self-pay

## 2017-12-21 ENCOUNTER — Other Ambulatory Visit: Payer: Self-pay

## 2017-12-21 ENCOUNTER — Emergency Department (HOSPITAL_COMMUNITY): Payer: No Typology Code available for payment source

## 2017-12-21 ENCOUNTER — Emergency Department (HOSPITAL_COMMUNITY)
Admission: EM | Admit: 2017-12-21 | Discharge: 2017-12-21 | Disposition: A | Discharge status: Home / Self Care | Payer: No Typology Code available for payment source | Attending: Emergency Medicine | Admitting: Emergency Medicine

## 2017-12-21 DIAGNOSIS — E119 Type 2 diabetes mellitus without complications: Secondary | ICD-10-CM | POA: Insufficient documentation

## 2017-12-21 DIAGNOSIS — Z79899 Other long term (current) drug therapy: Secondary | ICD-10-CM | POA: Insufficient documentation

## 2017-12-21 DIAGNOSIS — I10 Essential (primary) hypertension: Secondary | ICD-10-CM | POA: Diagnosis not present

## 2017-12-21 DIAGNOSIS — H47012 Ischemic optic neuropathy, left eye: Secondary | ICD-10-CM | POA: Diagnosis not present

## 2017-12-21 DIAGNOSIS — Z7984 Long term (current) use of oral hypoglycemic drugs: Secondary | ICD-10-CM | POA: Insufficient documentation

## 2017-12-21 DIAGNOSIS — H538 Other visual disturbances: Secondary | ICD-10-CM | POA: Diagnosis present

## 2017-12-21 HISTORY — DX: Type 2 diabetes mellitus without complications: E11.9

## 2017-12-21 LAB — BASIC METABOLIC PANEL
Anion gap: 10 (ref 5–15)
BUN: 15 mg/dL (ref 6–20)
CO2: 28 mmol/L (ref 22–32)
CREATININE: 0.86 mg/dL (ref 0.44–1.00)
Calcium: 9.9 mg/dL (ref 8.9–10.3)
Chloride: 103 mmol/L (ref 98–111)
Glucose, Bld: 113 mg/dL — ABNORMAL HIGH (ref 70–99)
Potassium: 3.3 mmol/L — ABNORMAL LOW (ref 3.5–5.1)
SODIUM: 141 mmol/L (ref 135–145)

## 2017-12-21 LAB — CBC
HCT: 39.8 % (ref 36.0–46.0)
Hemoglobin: 12.6 g/dL (ref 12.0–15.0)
MCH: 28 pg (ref 26.0–34.0)
MCHC: 31.7 g/dL (ref 30.0–36.0)
MCV: 88.4 fL (ref 80.0–100.0)
NRBC: 0 % (ref 0.0–0.2)
PLATELETS: 262 10*3/uL (ref 150–400)
RBC: 4.5 MIL/uL (ref 3.87–5.11)
RDW: 13.8 % (ref 11.5–15.5)
WBC: 9.2 10*3/uL (ref 4.0–10.5)

## 2017-12-21 LAB — SEDIMENTATION RATE: Sed Rate: 35 mm/hr — ABNORMAL HIGH (ref 0–22)

## 2017-12-21 MED ORDER — GADOBUTROL 1 MMOL/ML IV SOLN
10.0000 mL | Freq: Once | INTRAVENOUS | Status: AC | PRN
  Administered 2017-12-21: 10 mL via INTRAVENOUS

## 2017-12-21 MED ORDER — IOPAMIDOL (ISOVUE-370) INJECTION 76%
50.0000 mL | Freq: Once | INTRAVENOUS | Status: AC | PRN
  Administered 2017-12-21: 50 mL via INTRAVENOUS

## 2017-12-21 MED ORDER — LORAZEPAM 2 MG/ML IJ SOLN
1.0000 mg | Freq: Once | INTRAMUSCULAR | Status: AC
  Administered 2017-12-21: 1 mg via INTRAVENOUS
  Filled 2017-12-21: qty 1

## 2017-12-21 MED ORDER — IOPAMIDOL (ISOVUE-370) INJECTION 76%
INTRAVENOUS | Status: AC
  Filled 2017-12-21: qty 50

## 2017-12-21 NOTE — Consult Note (Signed)
Neurology Consultation Reason for Consult: Vision change Referring Physician: Maryan Rued, W  CC: Vision change  History is obtained from: Patient  HPI: Miranda Hernandez is a 57 y.o. female the history of hypertension, diabetes, hypercholesterolemia who presents with a 2-week history of progressive vision loss in the left upper half of her visual field.  She states that it has been getting progressively worse, but has been stable over the past 2 days.  There has been no pain associated with it.  She denies headache, weight gain, weight loss, jaw claudication, irregular heartbeats, racing heartbeats, or any other symptoms.  She denies any history of transient neurological deficits such as weakness or numbness.  ROS: A 14 point ROS was performed and is negative except as noted in the HPI.  Past Medical History:  Diagnosis Date  . Anemia   . Diabetes mellitus without complication ([place])   . HTN (hypertension)   . Hypercholesteremia   . Obesity      Family History  Problem Relation Age of Onset  . Diabetes Mother   . Heart disease Mother   . Stroke Mother   . Hypertension Mother   . Heart disease Father   . Hypertension Father   . Arthritis Neg Hx   . Colon cancer Neg Hx      Social History:  reports that she has never smoked. She has never used smokeless tobacco. She reports that she drinks alcohol. She reports that she does not use drugs.   Exam: Current vital signs: BP (!) 89/65 (BP Location: Right Arm)   Pulse 69   Temp 98 F (36.7 C) (Oral)   Resp 17   Ht '5\' 5"'  (1.651 m)   Wt 104.8 kg   SpO2 96%   BMI 38.44 kg/m  Vital signs in last 24 hours: Temp:  [98 F (36.7 C)] 98 F (36.7 C) (10/16 2302) Pulse Rate:  [43-75] 69 (10/16 2302) Resp:  [16-17] 17 (10/16 2302) BP: (89-138)/(65-89) 89/65 (10/16 2302) SpO2:  [96 %-100 %] 96 % (10/16 2302) Weight:  [104.8 kg] 104.8 kg (10/16 1511)   Physical Exam  Constitutional: Appears well-developed and well-nourished.   Psych: Affect appropriate to situation Eyes: No scleral injection HENT: No OP obstrucion Head: Normocephalic.  Cardiovascular: Normal rate and regular rhythm.  Respiratory: Effort normal, non-labored breathing GI: Soft.  No distension. There is no tenderness.  Skin: WDI  Neuro: Mental Status: Patient is awake, alert, oriented to person, place, month, year, and situation. Patient is able to give a clear and coherent history. No signs of aphasia or neglect Cranial Nerves: II: She has decreased ability to count fingers in the left upper visual field but does see some movement.  Pupils are equal, round, and reactive to light.   III,IV, VI: EOMI without ptosis or diploplia.  V: Facial sensation is symmetric to temperature VII: Facial movement is symmetric.  VIII: hearing is intact to voice X: Uvula elevates symmetrically XI: Shoulder shrug is symmetric. XII: tongue is midline without atrophy or fasciculations.  Motor: Tone is normal. Bulk is normal. 5/5 strength was present in all four extremities.  Sensory: Sensation is symmetric to light touch and temperature in the arms and legs. Cerebellar: FNF and HKS are intact bilaterally  I have reviewed labs in epic and the results pertinent to this consultation are: CBC-unremarkable BMP-unremarkable  I have reviewed the images obtained: MRI of her brain and orbits with and without contrast-negative  Impression: 57 year old female with a history of multiple  risk factors including hypertension, hyperlipidemia, diabetes who presents with progressive painless altitudinal visual defect in the left eye.  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  The lack of pain, altitudnal in the nature of the defect, and lack of any changes on imaging argue against optic neuritis.  The progressive nature argue strongly against arteritic disease.  That being said, given the recognitions of the ophthalmologist for carotid  studies and evaluate for temporal arteritis, I did think that it was reasonable to perform a CTA and ESR and I made these recommendations after seeing the patient, they are available at the time of finalizing this note.  CT is negative, but ESR is mildly elevated.  An ESR of 35 is very atypical of temporal arteritis, which typically has much more highly elevated, and in a young patient like this if this was the cause I would expect to be much higher.  With symptoms only mildly concerning for TA and labs which are not strongly supportive of it either, I would not pursue any further testing/treatment for this unless further symptoms were to develop.  Recommendations: 1) CTA head and neck (already performed as described above at the time of finalizing this note) 2) ESR (also performed at the time of finalizing this note, please see above) 3) follow-up with outpatient ophthalmology   Roland Rack, MD [facility] [phone]  If 7pm- 7am, please page neurology on call as listed in [place].

## 2017-12-21 NOTE — ED Provider Notes (Signed)
Patient placed in Quick Look pathway, seen and evaluated   Chief Complaint: visual loss in left eye since 10/5  HPI:   She was seen by her eye Dr who sent her here for stroke vs MS evaluation.  She reports that she thought it was strained, it started in the upper part of her eye and has spread down since.  She has paper from ophthalmologist with their recommendations regarding MRI evaluation.  Her eyes were dilated by ophthalmologist, however he noted a afferent pupillary defect.  ROS: No fevers (one)  Physical Exam:   Gen: No distress  Neuro: Awake and Alert  Skin: Warm    Focused Exam: EOMs intact bilaterally.  Eyes are both dilated.  Speech is not slurred, no facial droop.   Initiation of care has begun. The patient has been counseled on the process, plan, and necessity for staying for the completion/evaluation, and the remainder of the medical screening examination    Norman Clay 12/21/17 1517    Loren Racer, MD 12/29/17 2130

## 2017-12-21 NOTE — ED Notes (Signed)
Patient transported to MRI 

## 2017-12-21 NOTE — ED Triage Notes (Signed)
Pt endorses decreased vision in left eye since 12/10/17, went to eye dr last week and visual loss is more wide spread in left eye since then, pt sent here for stroke rule out. VSS. NO other neuro sx.

## 2017-12-21 NOTE — ED Provider Notes (Signed)
[facility] DEPARTMENT Provider Note   CSN: [id number] Arrival date & time: 12/21/17  1504     History   Chief Complaint Chief Complaint  Patient presents with  . Visual Field Change    HPI Shalece Staffa Holtzclaw is a 57 y.o. female with type 2 DM, HTN who presents from ophthalmologist's office for evaluation of acute onset vision deficit. It began 10 days ago when the vision in the upper field of her left eye got blurry. She also reports black splotches in her vision. Denies pain or eye redness. She was evaluated by her eye doctor who referred her to a specialist. Over the last 10 days, the visual field defect has expanded to now involve all most her entire left eye with blurry vision.   She denies complete vision loss, eye pain, headache, n/v/d, additional neurologic deficits.  She has paper's from her outpatient ophthalmologist and was noted to have disc edema and afferent pupillary defect of the left eye on his exam.   HPI  Past Medical History:  Diagnosis Date  . Anemia   . Diabetes mellitus without complication ([place])   . HTN (hypertension)   . Hypercholesteremia   . Obesity     Patient Active Problem List   Diagnosis Date Noted  . Hyperlipidemia 01/17/2013  . Obesity 09/30/2011  . Hypertension 09/30/2011  . HGSIL (high grade squamous intraepithelial dysplasia) 09/30/2011  . S/P vaginal hysterectomy 09/30/2011    Past Surgical History:  Procedure Laterality Date  . ABDOMINAL HYSTERECTOMY    . CESAREAN SECTION    . GASTRIC BYPASS       OB History    Gravida  3   Para  2   Term      Preterm      AB  1   Living        SAB  1   TAB      Ectopic      Multiple      Live Births               Home Medications    Prior to Admission medications   Medication Sig Start Date End Date Taking? Authorizing Provider  amLODipine-benazepril (LOTREL) 10-40 MG per capsule TAKE 1 CAPSULE BY MOUTH ONCE DAILY   Yes Belva Crome, MD  hydrochlorothiazide (HYDRODIURIL) 25 MG tablet Take 25 mg by mouth daily.   Yes [provider]  metFORMIN (GLUCOPHAGE) 1000 MG tablet Take 1,000 mg by mouth daily.    Yes [provider]  metoprolol succinate (TOPROL-XL) 100 MG 24 hr tablet TAKE 1 & 1/2 TABLETS BY MOUTH DAILY 09/03/13  Yes Belva Crome, MD  mometasone (NASONEX) 50 MCG/ACT nasal spray Place 2 sprays into the nose daily.   Yes [provider]  amLODipine-benazepril (LOTREL) 10-40 MG per capsule Take 1 capsule by mouth daily.    [provider]  docusate sodium (COLACE) 100 MG capsule Take 100 mg by mouth 2 (two) times daily.    [provider]  ferrous sulfate 325 (65 FE) MG tablet Take 325 mg by mouth daily with breakfast.    [provider]  ibuprofen (ADVIL,MOTRIN) 600 MG tablet Take 1 tablet (600 mg total) by mouth every 8 (eight) hours as needed. Patient not taking: Reported on 12/21/2017 05/19/16   Edrick Kins, DPM  metoprolol (LOPRESSOR) 100 MG tablet Take 150 mg daily    [provider]    Family History  Family History  Problem Relation Age of Onset  . Diabetes Mother   . Heart disease Mother   . Stroke Mother   . Hypertension Mother   . Heart disease Father   . Hypertension Father   . Arthritis Neg Hx   . Colon cancer Neg Hx     Social History Social History   Tobacco Use  . Smoking status: Never Smoker  . Smokeless tobacco: Never Used  Substance Use Topics  . Alcohol use: Yes    Comment: occ. wine  . Drug use: No     Allergies   Patient has no known allergies.   Review of Systems Review of Systems See HPI  Physical Exam Updated Vital Signs BP (!) 89/65 (BP Location: Right Arm)   Pulse 69   Temp 98 F (36.7 C) (Oral)   Resp 17   Ht '5\' 5"'  (1.651 m)   Wt 104.8 kg   SpO2 96%   BMI 38.44 kg/m   Physical Exam Vitals:   12/21/17 1730 12/21/17 1745 12/21/17 2115 12/21/17 2302  BP: 114/69 115/70 102/72 (!) 89/65    Pulse: 64 63 66 69  Resp:    17  Temp:    98 F (36.7 C)  TempSrc:    Oral  SpO2: 100% 99% 100% 96%  Weight:      Height:       General: Vital signs reviewed.  Patient is well-developed and well-nourished, in no acute distress and cooperative with exam.  Head: Normocephalic and atraumatic. Eyes: EOMI, conjunctivae normal, dilated pupils, no temporal tenderness Cardiovascular: RRR Pulmonary/Chest: Clear to auscultation bilaterally, no wheezes, rales, or rhonchi Musculoskeletal: No joint deformities, erythema, or stiffness, ROM full and nontender. Extremities: No lower extremity edema bilaterally,  pulses symmetric and intact bilaterally Neurological: A&O x3, Strength is normal and symmetric bilaterally, cranial nerve II-XII are grossly intact, no focal motor deficit, sensory intact to light touch bilaterally.  Skin: Warm, dry and intact. No rashes or erythema. Psychiatric: Normal mood and affect. speech and behavior is normal. Cognition and memory are normal.    ED Treatments / Results  Labs (all labs ordered are listed, but only abnormal results are displayed) Labs Reviewed  BASIC METABOLIC PANEL - Abnormal; Notable for the following components:      Result Value   Potassium 3.3 (*)    Glucose, Bld 113 (*)    All other components within normal limits  SEDIMENTATION RATE - Abnormal; Notable for the following components:   Sed Rate 35 (*)    All other components within normal limits  CBC    EKG None  Radiology Ct Angio Head W Or Wo Contrast  Result Date: 12/21/2017 CLINICAL DATA:  57 y/o F; vision loss in the left eye since 12/10/2017. EXAM: CT ANGIOGRAPHY HEAD AND NECK TECHNIQUE: Multidetector CT imaging of the head and neck was performed using the standard protocol during bolus administration of intravenous contrast. Multiplanar CT image reconstructions and MIPs were obtained to evaluate the vascular anatomy. Carotid stenosis measurements (when applicable) are obtained  utilizing NASCET criteria, using the distal internal carotid diameter as the denominator. CONTRAST:  60m ISOVUE-370 IOPAMIDOL (ISOVUE-370) INJECTION 76% COMPARISON:  12/21/2017 MRI of the head. FINDINGS: CT HEAD FINDINGS Brain: No evidence of acute infarction, hemorrhage, hydrocephalus, extra-axial collection or mass lesion/mass effect. Several white matter hypodensities correspond to T2 FLAIR signal abnormality on the prior MRI of the head with a are better characterized. Stable mild volume loss of the brain. Vascular: As  below. Skull: Normal. Negative for fracture or focal lesion. Sinuses: Imaged portions are clear. Orbits: No acute finding. Review of the MIP images confirms the above findings CTA NECK FINDINGS Aortic arch: Not within the field of view. Right carotid system: No evidence of dissection, stenosis (50% or greater) or occlusion. Left carotid system: No evidence of dissection, stenosis (50% or greater) or occlusion. Vertebral arteries: Codominant. No evidence of dissection, stenosis (50% or greater) or occlusion. Skeleton: Cervical spondylosis greatest at the C5-6 level or a calcified disc osteophyte complex results in mild canal stenosis. Other neck: Subcentimeter nodule in the right lobe of the thyroid. Upper chest: Negative. Review of the MIP images confirms the above findings CTA HEAD FINDINGS Anterior circulation: No significant stenosis, proximal occlusion, aneurysm, or vascular malformation. Mild non stenotic calcific atherosclerosis of the carotid bifurcations. Posterior circulation: No significant stenosis, proximal occlusion, aneurysm, or vascular malformation. Venous sinuses: As permitted by contrast timing, patent. Anatomic variants: Complete circle-of-Willis. Diminutive A-comm. Fetal right PCA. Delayed phase: No abnormal intracranial enhancement. Review of the MIP images confirms the above findings IMPRESSION: 1. Patent carotid and vertebral arteries. No dissection, aneurysm, or  hemodynamically significant stenosis utilizing NASCET criteria. 2. Patent anterior and posterior intracranial circulation. No large vessel occlusion, aneurysm, or significant stenosis. Electronically Signed   By: Kristine Garbe M.D.   On: 12/21/2017 22:25   Ct Angio Neck W Or Wo Contrast  Result Date: 12/21/2017 CLINICAL DATA:  57 y/o F; vision loss in the left eye since 12/10/2017. EXAM: CT ANGIOGRAPHY HEAD AND NECK TECHNIQUE: Multidetector CT imaging of the head and neck was performed using the standard protocol during bolus administration of intravenous contrast. Multiplanar CT image reconstructions and MIPs were obtained to evaluate the vascular anatomy. Carotid stenosis measurements (when applicable) are obtained utilizing NASCET criteria, using the distal internal carotid diameter as the denominator. CONTRAST:  50m ISOVUE-370 IOPAMIDOL (ISOVUE-370) INJECTION 76% COMPARISON:  12/21/2017 MRI of the head. FINDINGS: CT HEAD FINDINGS Brain: No evidence of acute infarction, hemorrhage, hydrocephalus, extra-axial collection or mass lesion/mass effect. Several white matter hypodensities correspond to T2 FLAIR signal abnormality on the prior MRI of the head with a are better characterized. Stable mild volume loss of the brain. Vascular: As below. Skull: Normal. Negative for fracture or focal lesion. Sinuses: Imaged portions are clear. Orbits: No acute finding. Review of the MIP images confirms the above findings CTA NECK FINDINGS Aortic arch: Not within the field of view. Right carotid system: No evidence of dissection, stenosis (50% or greater) or occlusion. Left carotid system: No evidence of dissection, stenosis (50% or greater) or occlusion. Vertebral arteries: Codominant. No evidence of dissection, stenosis (50% or greater) or occlusion. Skeleton: Cervical spondylosis greatest at the C5-6 level or a calcified disc osteophyte complex results in mild canal stenosis. Other neck: Subcentimeter nodule  in the right lobe of the thyroid. Upper chest: Negative. Review of the MIP images confirms the above findings CTA HEAD FINDINGS Anterior circulation: No significant stenosis, proximal occlusion, aneurysm, or vascular malformation. Mild non stenotic calcific atherosclerosis of the carotid bifurcations. Posterior circulation: No significant stenosis, proximal occlusion, aneurysm, or vascular malformation. Venous sinuses: As permitted by contrast timing, patent. Anatomic variants: Complete circle-of-Willis. Diminutive A-comm. Fetal right PCA. Delayed phase: No abnormal intracranial enhancement. Review of the MIP images confirms the above findings IMPRESSION: 1. Patent carotid and vertebral arteries. No dissection, aneurysm, or hemodynamically significant stenosis utilizing NASCET criteria. 2. Patent anterior and posterior intracranial circulation. No large vessel occlusion, aneurysm, or significant stenosis.  Electronically Signed   By: Kristine Garbe M.D.   On: 12/21/2017 22:25   Mr Jeri Cos IO Contrast  Result Date: 12/21/2017 CLINICAL DATA:  Decreased vision in the left eye. EXAM: MRI HEAD AND ORBITS WITHOUT AND WITH CONTRAST TECHNIQUE: Multiplanar, multiecho pulse sequences of the brain and surrounding structures were obtained without and with intravenous contrast. Multiplanar, multiecho pulse sequences of the orbits and surrounding structures were obtained including fat saturation techniques, before and after intravenous contrast administration. CONTRAST:  10 mL Gadavist COMPARISON:  None. FINDINGS: MRI HEAD FINDINGS BRAIN: There is no acute infarct, acute hemorrhage, hydrocephalus or extra-axial collection. The midline structures are normal. No midline shift or other mass effect. There are no old infarcts. Multifocal white matter hyperintensity, most commonly due to chronic ischemic microangiopathy. The cerebral and cerebellar volume are age-appropriate. Susceptibility-sensitive sequences show no  chronic microhemorrhage or superficial siderosis. VASCULAR: Major intracranial arterial and venous sinus flow voids are normal. SKULL AND UPPER CERVICAL SPINE: Calvarial bone marrow signal is normal. There is no skull base mass. Visualized upper cervical spine and soft tissues are normal. MRI ORBITS FINDINGS Orbits: --Globes: Normal. --Bony orbit: Normal. --Preseptal soft tissues: Normal. --Intra- and extraconal orbital fat: Normal. No inflammatory stranding. --Optic nerves: Normal. --Lacrimal glands and fossae: Normal. --Extraocular muscles: Normal. Visualized sinuses:  No fluid levels or advanced mucosal thickening. Soft tissues: Normal. Limited intracranial: Normal. IMPRESSION: 1. Multiple white matter lesions in a nonspecific pattern, most suggestive of chronic small vessel ischemia. The pattern is not typical of demyelination. 2. Normal MRI of the orbits. Electronically Signed   By: Ulyses Jarred M.D.   On: 12/21/2017 19:54   Mr Rosealee Albee NG Contrast  Result Date: 12/21/2017 CLINICAL DATA:  Decreased vision in the left eye. EXAM: MRI HEAD AND ORBITS WITHOUT AND WITH CONTRAST TECHNIQUE: Multiplanar, multiecho pulse sequences of the brain and surrounding structures were obtained without and with intravenous contrast. Multiplanar, multiecho pulse sequences of the orbits and surrounding structures were obtained including fat saturation techniques, before and after intravenous contrast administration. CONTRAST:  10 mL Gadavist COMPARISON:  None. FINDINGS: MRI HEAD FINDINGS BRAIN: There is no acute infarct, acute hemorrhage, hydrocephalus or extra-axial collection. The midline structures are normal. No midline shift or other mass effect. There are no old infarcts. Multifocal white matter hyperintensity, most commonly due to chronic ischemic microangiopathy. The cerebral and cerebellar volume are age-appropriate. Susceptibility-sensitive sequences show no chronic microhemorrhage or superficial siderosis. VASCULAR:  Major intracranial arterial and venous sinus flow voids are normal. SKULL AND UPPER CERVICAL SPINE: Calvarial bone marrow signal is normal. There is no skull base mass. Visualized upper cervical spine and soft tissues are normal. MRI ORBITS FINDINGS Orbits: --Globes: Normal. --Bony orbit: Normal. --Preseptal soft tissues: Normal. --Intra- and extraconal orbital fat: Normal. No inflammatory stranding. --Optic nerves: Normal. --Lacrimal glands and fossae: Normal. --Extraocular muscles: Normal. Visualized sinuses:  No fluid levels or advanced mucosal thickening. Soft tissues: Normal. Limited intracranial: Normal. IMPRESSION: 1. Multiple white matter lesions in a nonspecific pattern, most suggestive of chronic small vessel ischemia. The pattern is not typical of demyelination. 2. Normal MRI of the orbits. Electronically Signed   By: Ulyses Jarred M.D.   On: 12/21/2017 19:54    Procedures Procedures (including critical care time)  Medications Ordered in ED Medications  iopamidol (ISOVUE-370) 76 % injection (has no administration in time range)  LORazepam (ATIVAN) injection 1 mg (1 mg Intravenous Given 12/21/17 1801)  gadobutrol (GADAVIST) 1 MMOL/ML injection 10 mL (10 mLs Intravenous  Contrast Given 12/21/17 1922)  iopamidol (ISOVUE-370) 76 % injection 50 mL (50 mLs Intravenous Contrast Given 12/21/17 2153)     Initial Impression / Assessment and Plan / ED Course  I have reviewed the triage vital signs and the nursing notes.  Pertinent labs & imaging results that were available during my care of the patient were reviewed by me and considered in my medical decision making (see chart for details).     57yo female with sudden, painless vision loss 10 days ago with progression. Denies headache, no temporal tenderness. Doubt giant cell arteritis. Disc edema and afferent pupillary defect on ophthalmologist's exam concerning for central retinal artery occlusion. Other differential includes optic neuritis or  MS.   MRI of brain and orbits are normal.   Consulted neurology who also spoke with ophthalmologist over the phone. Most concerned for ischemic optic neuropathy. Will order CTA head/neck and check ESR.   CTA head/neck is normal. Most likely nonarteritic ischemic optic neuropathy. ESR is slightly elevated at 35 which is less than two times upper limit of normal. She has no tenderness over her temple. Discussed results with neurologist. Low concern for giant cell arteritis. We do not think admission for IV steroids and biopsy are indicated at this time.   Given normal CTA head/neck, patient is safe to discharge on baby aspirin daily and instructed to follow up with ophthalmologist tomorrow. Patient's vision remained stable while in the ED and did not develop any eye pain. If vision or eye pain worsens, instructed to return to the ED.   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

## 2017-12-21 NOTE — Discharge Instructions (Signed)
You have been diagnosed with nonarteritic ischemic optic neuropathy. Please start taking a baby aspirin 81mg  once daily and call your ophthalmologist's office tomorrow to follow up. Your vision will hopefully improve over time and with taking aspirin. If you experience worsening vision loss or eye pain or other stroke like symptoms please see a doctor immediately. Also follow up with your regular doctor to make sure your cholesterol is normal.

## 2017-12-22 MED FILL — BENAZEPRIL HCL 40 MG TABLET: 40 | 90 days supply | Qty: 90 | Fill #2

## 2017-12-22 MED FILL — BRIMONIDINE 0.2% EYE DROP: 0.2 | 30 days supply | Qty: 5 | Fill #0

## 2017-12-22 MED FILL — AMLODIPINE BESYLATE 10 MG T: 10 | 90 days supply | Qty: 90 | Fill #0

## 2018-01-13 MED FILL — BRIMONIDINE TARTRATE 0.2 %: 0.2 | 33 days supply | Qty: 5 | Fill #0

## 2018-01-19 MED FILL — metFORMIN HCL ER 500 MG TB2: 500 | 90 days supply | Qty: 180 | Fill #2

## 2018-01-20 MED FILL — TRULICITY 0.75 MG/0.5 ML PE: 0.75 | 28 days supply | Qty: 2 | Fill #2

## 2018-02-06 MED FILL — MOMETASONE FUROATE 50 MCG S: 50 | 30 days supply | Qty: 17 | Fill #0

## 2018-02-06 MED FILL — PRAVASTATIN SODIUM 10 MG TA: 10 | 30 days supply | Qty: 30 | Fill #0

## 2018-02-20 MED FILL — MOMETASONE FUROATE 50 MCG S: 50 | 30 days supply | Qty: 17 | Fill #0

## 2018-02-20 MED FILL — TRULICITY 0.75 MG/0.5 ML PE: 0.75 | 28 days supply | Qty: 2 | Fill #0

## 2018-02-20 MED FILL — HYDROCHLOROTHIAZIDE 25 MG T: 25 | 90 days supply | Qty: 90 | Fill #1

## 2018-02-20 MED FILL — PRAVASTATIN SODIUM 10 MG TA: 10 | 30 days supply | Qty: 30 | Fill #0

## 2018-03-17 MED FILL — BRIMONIDINE TARTRATE 0.2 %: 0.2 | 33 days supply | Qty: 5 | Fill #1

## 2018-03-17 MED FILL — METOPROLOL SUCCINATE ER 100: 100 | 90 days supply | Qty: 135 | Fill #1

## 2018-04-10 MED FILL — TRULICITY 0.75 MG/0.5 ML PE: 0.75 | 28 days supply | Qty: 2 | Fill #1

## 2018-04-19 MED FILL — BENAZEPRIL HCL 40 MG TABLET: 40 | 90 days supply | Qty: 90 | Fill #0

## 2018-04-19 MED FILL — AMLODIPINE BESYLATE 10 MG T: 10 | 90 days supply | Qty: 90 | Fill #0

## 2018-05-12 MED FILL — TRULICITY 0.75 MG/0.5 ML PE: 0.75 | 28 days supply | Qty: 2 | Fill #2

## 2018-05-12 MED FILL — metFORMIN HCL ER 500 MG TB2: 500 | 90 days supply | Qty: 180 | Fill #0

## 2018-06-16 MED FILL — PRAVASTATIN SODIUM 10 MG TA: 10 | 30 days supply | Qty: 30 | Fill #1

## 2018-06-16 MED FILL — BRIMONIDINE TARTRATE 0.2 %: 0.2 | 33 days supply | Qty: 5 | Fill #2

## 2018-06-16 MED FILL — TRULICITY 0.75 MG/0.5 ML PE: 0.75 | 28 days supply | Qty: 2 | Fill #3

## 2018-06-16 MED FILL — MOMETASONE FUROATE 50 MCG S: 50 | 30 days supply | Qty: 17 | Fill #1

## 2018-06-19 MED FILL — HYDROCHLOROTHIAZIDE 25 MG T: 25 | 90 days supply | Qty: 90 | Fill #0

## 2018-07-12 MED FILL — METOPROLOL SUCCINATE ER 100: 100 | 90 days supply | Qty: 135 | Fill #0

## 2018-07-28 MED FILL — PRAVASTATIN SODIUM 10 MG TA: 10 | 30 days supply | Qty: 30 | Fill #2

## 2018-07-28 MED FILL — TRULICITY 0.75 MG/0.5 ML PE: 0.75 | 28 days supply | Qty: 2 | Fill #4

## 2018-08-09 MED FILL — metFORMIN HCL ER 500 MG TB2: 500 | 90 days supply | Qty: 180 | Fill #0

## 2018-08-09 MED FILL — AMLODIPINE BESYLATE 10 MG T: 10 | 90 days supply | Qty: 90 | Fill #0

## 2018-08-09 MED FILL — BENAZEPRIL HCL 40 MG TABLET: 40 | 90 days supply | Qty: 90 | Fill #0

## 2018-08-09 MED FILL — TRULICITY 1.5 MG/0.5 ML PEN: 1.5 | 28 days supply | Qty: 2 | Fill #0

## 2018-08-21 MED FILL — BENAZEPRIL HCL 40 MG TABLET: 40 | 90 days supply | Qty: 90 | Fill #0

## 2018-08-21 MED FILL — TRULICITY 1.5 MG/0.5 ML PEN: 1.5 | 28 days supply | Qty: 2 | Fill #0

## 2018-08-21 MED FILL — AMLODIPINE BESYLATE 10 MG T: 10 | 90 days supply | Qty: 90 | Fill #0

## 2018-09-26 MED FILL — TRULICITY 1.5 MG/0.5 ML PEN: 1.5 | 28 days supply | Qty: 2 | Fill #1

## 2018-09-26 MED FILL — METFORMIN HCL ER 500 MG TB2: 500 | 30 days supply | Qty: 60 | Fill #0

## 2018-09-28 ENCOUNTER — Encounter: Payer: Self-pay | Admitting: Family Medicine

## 2018-09-28 ENCOUNTER — Ambulatory Visit: Payer: No Typology Code available for payment source | Admitting: Family Medicine

## 2018-09-28 ENCOUNTER — Other Ambulatory Visit: Payer: Self-pay

## 2018-09-28 ENCOUNTER — Ambulatory Visit: Payer: Self-pay

## 2018-09-28 VITALS — BP 122/64 | HR 89 | Ht 65.0 in | Wt 231.0 lb

## 2018-09-28 DIAGNOSIS — M25512 Pain in left shoulder: Secondary | ICD-10-CM

## 2018-09-28 DIAGNOSIS — G8929 Other chronic pain: Secondary | ICD-10-CM

## 2018-09-28 MED ORDER — VITAMIN D (ERGOCALCIFEROL) 1.25 MG (50000 UNIT) PO CAPS: 50000.0000 [IU] | ORAL_CAPSULE | ORAL | 0 refills | Status: DC

## 2018-09-28 MED FILL — VIT D2 1.25 MG (50,000 UNIT: 1.25 MG | 84 days supply | Qty: 12 | Fill #0

## 2018-09-28 NOTE — Progress Notes (Signed)
[facility] [place], [place] [address] Phone: [phone] Subjective:   I Miranda Hernandez am serving as a Education administrator for Dr. Hulan Saas.   CC: Left shoulder pain  [id number]  Miranda Hernandez is a 58 y.o. female coming in with complaint of left shoulder pain. Uses the computer a lot and sleeps on her left side. Pain radiates down the arm.   Onset- 3 months  Location - Deltoid  Duration-  Character- stabbing Aggravating factors- flexion Reliving factors-decreasing range of motion Therapies tried- oral meds, massage, warm compress Severity-6 out of 10     Past Medical History:  Diagnosis Date  . Anemia   . Diabetes mellitus without complication ([place])   . HTN (hypertension)   . Hypercholesteremia   . Obesity    Past Surgical History:  Procedure Laterality Date  . ABDOMINAL HYSTERECTOMY    . CESAREAN SECTION    . GASTRIC BYPASS     Social History   Socioeconomic History  . Marital status: Married    Spouse name: Not on file  . Number of children: Not on file  . Years of education: Not on file  . Highest education level: Not on file  Occupational History  . Not on file  Social Needs  . Financial resource strain: Not on file  . Food insecurity    Worry: Not on file    Inability: Not on file  . Transportation needs    Medical: Not on file    Non-medical: Not on file  Tobacco Use  . Smoking status: Never Smoker  . Smokeless tobacco: Never Used  Substance and Sexual Activity  . Alcohol use: Yes    Comment: occ. wine  . Drug use: No  . Sexual activity: Yes    Birth control/protection: Surgical    Comment: Hysterectomy  Lifestyle  . Physical activity    Days per week: Not on file    Minutes per session: Not on file  . Stress: Not on file  Relationships  . Social Herbalist on phone: Not on file    Gets together: Not on file    Attends religious service: Not on file    Active member of club or  organization: Not on file    Attends meetings of clubs or organizations: Not on file    Relationship status: Not on file  Other Topics Concern  . Not on file  Social History Narrative  . Not on file   No Known Allergies Family History  Problem Relation Age of Onset  . Diabetes Mother   . Heart disease Mother   . Stroke Mother   . Hypertension Mother   . Heart disease Father   . Hypertension Father   . Arthritis Neg Hx   . Colon cancer Neg Hx     Current Outpatient Medications (Endocrine & Metabolic):  .  metFORMIN (GLUCOPHAGE) 1000 MG tablet, Take 1,000 mg by mouth daily.   Current Outpatient Medications (Cardiovascular):  .  amLODipine-benazepril (LOTREL) 10-40 MG per capsule, Take 1 capsule by mouth daily. Marland Kitchen  amLODipine-benazepril (LOTREL) 10-40 MG per capsule, TAKE 1 CAPSULE BY MOUTH ONCE DAILY .  hydrochlorothiazide (HYDRODIURIL) 25 MG tablet, Take 25 mg by mouth daily. .  metoprolol (LOPRESSOR) 100 MG tablet, Take 150 mg daily .  metoprolol succinate (TOPROL-XL) 100 MG 24 hr tablet, TAKE 1 & 1/2 TABLETS BY MOUTH DAILY  Current Outpatient Medications (Respiratory):  .  mometasone (  NASONEX) 50 MCG/ACT nasal spray, Place 2 sprays into the nose daily.  Current Outpatient Medications (Analgesics):  .  ibuprofen (ADVIL,MOTRIN) 600 MG tablet, Take 1 tablet (600 mg total) by mouth every 8 (eight) hours as needed.  Current Outpatient Medications (Hematological):  .  ferrous sulfate 325 (65 FE) MG tablet, Take 325 mg by mouth daily with breakfast.  Current Outpatient Medications (Other):  .  docusate sodium (COLACE) 100 MG capsule, Take 100 mg by mouth 2 (two) times daily. .  Vitamin D, Ergocalciferol, (DRISDOL) 1.25 MG (50000 UT) CAPS capsule, Take 1 capsule (50,000 Units total) by mouth every 7 (seven) days.    Past medical history, social, surgical and family history all reviewed in electronic medical record.  No pertanent information unless stated regarding to the chief  complaint.   Review of Systems:  No headache, visual changes, nausea, vomiting, diarrhea, constipation, dizziness, abdominal pain, skin rash, fevers, chills, night sweats, weight loss, swollen lymph nodes, body aches, joint swelling,  chest pain, shortness of breath, mood changes.  Positive muscle aches  Objective  Blood pressure 122/64, pulse 89, height 5\' 5"  (1.651 m), weight 231 lb (104.8 kg), SpO2 98 %.    General: No apparent distress alert and oriented x3 mood and affect normal, dressed appropriately.  HEENT: Pupils equal, extraocular movements intact  Respiratory: Patient's speak in full sentences and does not appear short of breath  Cardiovascular: No lower extremity edema, non tender, no erythema  Skin: Warm dry intact with no signs of infection or rash on extremities or on axial skeleton.  Abdomen: Soft nontender  Neuro: Cranial nerves II through XII are intact, neurovascularly intact in all extremities with 2+ DTRs and 2+ pulses.  Lymph: No lymphadenopathy of posterior or anterior cervical chain or axillae bilaterally.  Gait normal with good balance and coordination.  MSK:  Non tender with full range of motion and good stability and symmetric strength and tone of elbows, wrist, hip, knee and ankles bilaterally.  Left shoulder exam shows the patient does have some mild decrease in range of motion forward flexion to 130 degrees, external rotation of only 10 degrees, internal rotation to sacrum.  Rotator cuff strength appears to be intact.  Positive impingement.  No tenderness over the acromioclavicular joint.  Negative speeds test.  Limited musculoskeletal ultrasound was performed and interpreted by Miranda Hernandez  Limited ultrasound shows the anterior capsule is somewhat thickened.  Calcific changes of the rotator cuff noted.  Patient also has what appears to be a very small subacromial bursitis noted with hypoechoic changes.  No true rotator cuff tear appreciated.  Procedure:  Real-time Ultrasound Guided Injection of left glenohumeral joint Device: GE Logiq E  Ultrasound guided injection is preferred based studies that show increased duration, increased effect, greater accuracy, decreased procedural pain, increased response rate with ultrasound guided versus blind injection.  Verbal informed consent obtained.  Time-out conducted.  Noted no overlying erythema, induration, or other signs of local infection.  Skin prepped in a sterile fashion.  Local anesthesia: Topical Ethyl chloride.  With sterile technique and under real time ultrasound guidance:  Joint visualized.  21g 2 inch needle inserted posterior approach. Pictures taken for needle placement. Patient did have injection of 2 cc of 0.5% Marcaine, and 1cc of Kenalog 40 mg/dL. Completed without difficulty  Pain immediately resolved suggesting accurate placement of the medication.  Advised to call if fevers/chills, erythema, induration, drainage, or persistent bleeding.  Images permanently stored and available for review in the  ultrasound unit.  Impression: Technically successful ultrasound guided injection.  [id number]; 15 additional minutes spent for Therapeutic exercises as stated in above notes.  This included exercises focusing on stretching, strengthening, with significant focus on eccentric aspects.   Long term goals include an improvement in range of motion, strength, endurance as well as avoiding reinjury. Patient's frequency would include in 1-2 times a day, 3-5 times a week for a duration of 6-12 weeks. Shoulder Exercises that included:  Basic scapular stabilization to include adduction and depression of scapula Scaption, focusing on proper movement and good control Internal and External rotation utilizing a theraband, with elbow tucked at side entire time Rows with theraband which was givne   Proper technique shown and discussed handout in great detail with ATC.  All questions were discussed and answered.       Impression and Recommendations:     This case required medical decision making of moderate complexity. The above documentation has been reviewed and is accurate and complete Miranda SaaZachary M Levante Simones, DO       Note: This dictation was prepared with Dragon dictation along with smaller phrase technology. Any transcriptional errors that result from this process are unintentional.

## 2018-09-28 NOTE — Patient Instructions (Addendum)
Good to see you.  Ice 20 minutes 2 times daily. Usually after activity and before bed. Exercises 3 times a week.  Voltaren gel over the counter Once weekly vitamin D for 12 weeks.  See me again in 5-6 weeks

## 2018-09-28 NOTE — Assessment & Plan Note (Signed)
Patient has left shoulder pain.  Seems to be more secondary to calcific tendinitis as well as a bursitis.  Differential includes the possibility for frozen shoulder.  We discussed with patient about icing regimen, starting, given injection, work with Product/process development scientist to learn home exercises in greater detail.  Follow-up again in 4 to 6 weeks

## 2018-10-26 MED FILL — metFORMIN HCL ER 500 MG TB2: 500 | 30 days supply | Qty: 60 | Fill #1

## 2018-10-26 MED FILL — HYDROCHLOROTHIAZIDE 25 MG T: 25 | 90 days supply | Qty: 90 | Fill #0

## 2018-10-26 MED FILL — TRULICITY 1.5 MG/0.5 ML PEN: 1.5 | 28 days supply | Qty: 2 | Fill #2

## 2018-11-03 ENCOUNTER — Other Ambulatory Visit: Payer: Self-pay

## 2018-11-03 ENCOUNTER — Ambulatory Visit: Payer: No Typology Code available for payment source | Admitting: Family Medicine

## 2018-11-03 ENCOUNTER — Encounter: Payer: Self-pay | Admitting: Family Medicine

## 2018-11-03 DIAGNOSIS — M25512 Pain in left shoulder: Secondary | ICD-10-CM

## 2018-11-03 NOTE — Assessment & Plan Note (Signed)
Patient anxiety symptoms that are very consistent with partial shoulder.  Is making progress.  Patient declined formal physical therapy, continue vitamin D, discussed icing regimen and follow-up with me again in 8 weeks

## 2018-11-03 NOTE — Progress Notes (Signed)
Tawana ScaleZach Hernandez D.O.  Sports Medicine [address] [place], [place] [address] Phone: [phone] Subjective:   Miranda Hernandez, Miranda Hernandez, am serving as a scribe for Dr. Antoine PrimasZachary Hernandez.  CC: Shoulder pain follow-up  BJY:[id number]:Subjective   10/05/2018 Patient has left shoulder pain.  Seems to be more secondary to calcific tendinitis as well as a bursitis.  Differential includes the possibility for frozen shoulder.  We discussed with patient about icing regimen, starting, given injection, work with Event organiserathletic trainer to learn home exercises in greater detail.  Follow-up again in 4 to 6 weeks  Update 11/03/2018 Miranda CanaryBarbara A Corning is a 58 y.o. female coming in with complaint of left shoulder pain. Patient states that she is doing better. Still working on range of motion. Pain is less. Not doing exercises as often as she should. Is using vitamin d.  Overall would state that is about 90% better.     Past Medical History:  Diagnosis Date  . Anemia   . Diabetes mellitus without complication ([facility])   . HTN (hypertension)   . Hypercholesteremia   . Obesity    Past Surgical History:  Procedure Laterality Date  . ABDOMINAL HYSTERECTOMY    . CESAREAN SECTION    . GASTRIC BYPASS     Social History   Socioeconomic History  . Marital status: Married    Spouse name: Not on file  . Number of children: Not on file  . Years of education: Not on file  . Highest education level: Not on file  Occupational History  . Not on file  Social Needs  . Financial resource strain: Not on file  . Food insecurity    Worry: Not on file    Inability: Not on file  . Transportation needs    Medical: Not on file    Non-medical: Not on file  Tobacco Use  . Smoking status: Never Smoker  . Smokeless tobacco: Never Used  Substance and Sexual Activity  . Alcohol use: Yes    Comment: occ. wine  . Drug use: No  . Sexual activity: Yes    Birth control/protection: Surgical    Comment: Hysterectomy  Lifestyle  . Physical  activity    Days per week: Not on file    Minutes per session: Not on file  . Stress: Not on file  Relationships  . Social Musicianconnections    Talks on phone: Not on file    Gets together: Not on file    Attends religious service: Not on file    Active member of club or organization: Not on file    Attends meetings of clubs or organizations: Not on file    Relationship status: Not on file  Other Topics Concern  . Not on file  Social History Narrative  . Not on file   No Known Allergies Family History  Problem Relation Age of Onset  . Diabetes Mother   . Heart disease Mother   . Stroke Mother   . Hypertension Mother   . Heart disease Father   . Hypertension Father   . Arthritis Neg Hx   . Colon cancer Neg Hx     Current Outpatient Medications (Endocrine & Metabolic):  .  metFORMIN (GLUCOPHAGE) 1000 MG tablet, Take 1,000 mg by mouth daily.   Current Outpatient Medications (Cardiovascular):  .  amLODipine-benazepril (LOTREL) 10-40 MG per capsule, Take 1 capsule by mouth daily. Marland Kitchen.  amLODipine-benazepril (LOTREL) 10-40 MG per capsule, TAKE 1 CAPSULE BY MOUTH ONCE DAILY .  hydrochlorothiazide (HYDRODIURIL) 25 MG tablet, Take 25 mg by mouth daily. .  metoprolol (LOPRESSOR) 100 MG tablet, Take 150 mg daily .  metoprolol succinate (TOPROL-XL) 100 MG 24 hr tablet, TAKE 1 & 1/2 TABLETS BY MOUTH DAILY  Current Outpatient Medications (Respiratory):  .  mometasone (NASONEX) 50 MCG/ACT nasal spray, Place 2 sprays into the nose daily.  Current Outpatient Medications (Analgesics):  .  ibuprofen (ADVIL,MOTRIN) 600 MG tablet, Take 1 tablet (600 mg total) by mouth every 8 (eight) hours as needed.  Current Outpatient Medications (Hematological):  .  ferrous sulfate 325 (65 FE) MG tablet, Take 325 mg by mouth daily with breakfast.  Current Outpatient Medications (Other):  .  docusate sodium (COLACE) 100 MG capsule, Take 100 mg by mouth 2 (two) times daily. .  Vitamin D, Ergocalciferol,  (DRISDOL) 1.25 MG (50000 UT) CAPS capsule, Take 1 capsule (50,000 Units total) by mouth every 7 (seven) days.    Past medical history, social, surgical and family history all reviewed in electronic medical record.  No pertanent information unless stated regarding to the chief complaint.   Review of Systems:  No headache, visual changes, nausea, vomiting, diarrhea, constipation, dizziness, abdominal pain, skin rash, fevers, chills, night sweats, weight loss, swollen lymph nodes, body aches, joint swelling,  chest pain, shortness of breath, mood changes.  Positive muscle aches  Objective  Blood pressure 102/74, pulse 71, weight 223 lb (101.2 kg), SpO2 98 %.    General: No apparent distress alert and oriented x3 mood and affect normal, dressed appropriately.  HEENT: Pupils equal, extraocular movements intact  Respiratory: Patient's speak in full sentences and does not appear short of breath  Cardiovascular: No lower extremity edema, non tender, no erythema  Skin: Warm dry intact with no signs of infection or rash on extremities or on axial skeleton.  Abdomen: Soft nontender  Neuro: Cranial nerves II through XII are intact, neurovascularly intact in all extremities with 2+ DTRs and 2+ pulses.  Lymph: No lymphadenopathy of posterior or anterior cervical chain or axillae bilaterally.  Gait normal with good balance and coordination.  MSK:  tender with full range of motion and good stability and symmetric strength and tone of  elbows, wrist, hip, knee and ankles bilaterally.  Left shoulder exam still shows the patient has forward flexion of 160 degrees, external rotation of only 10 degrees but near full internal rotation which is an improvement.  Mild positive impingement    Impression and Recommendations:     This case required medical decision making of moderate complexity. The above documentation has been reviewed and is accurate and complete Lyndal Pulley, DO       Note: This  dictation was prepared with Dragon dictation along with smaller phrase technology. Any transcriptional errors that result from this process are unintentional.

## 2018-11-03 NOTE — Patient Instructions (Signed)
Good to see you  Ice is your friend Stay active Keep working at it See me again in 8 weeks

## 2018-11-09 MED FILL — METOPROLOL SUCCINATE ER 100: 100 | 90 days supply | Qty: 135 | Fill #1

## 2018-11-28 MED FILL — TRULICITY 1.5 MG/0.5 ML PEN: 1.5 | 28 days supply | Qty: 2 | Fill #3

## 2018-12-19 ENCOUNTER — Other Ambulatory Visit: Payer: Self-pay | Admitting: Family Medicine

## 2018-12-19 ENCOUNTER — Ambulatory Visit: Payer: No Typology Code available for payment source | Admitting: Family Medicine

## 2018-12-19 MED FILL — BENAZEPRIL HCL 40 MG TABLET: 40 | 90 days supply | Qty: 90 | Fill #1

## 2018-12-19 MED FILL — AMLODIPINE BESYLATE 10 MG T: 10 | 90 days supply | Qty: 90 | Fill #1

## 2018-12-19 MED FILL — METFORMIN HCL ER 500 MG TB2: 500 | 30 days supply | Qty: 60 | Fill #2

## 2018-12-20 MED FILL — VIT D2 1.25 MG (50,000 UNIT: 1.25 MG | 84 days supply | Qty: 12 | Fill #0

## 2019-01-12 MED FILL — TRULICITY 1.5 MG/0.5 ML PEN: 1.5 | 28 days supply | Qty: 2 | Fill #4

## 2019-02-07 MED FILL — METFORMIN HCL ER 500 MG TB2: 500 | 30 days supply | Qty: 60 | Fill #3

## 2019-02-20 MED FILL — TRULICITY 1.5 MG/0.5 ML PEN: 1.5 | 28 days supply | Qty: 2 | Fill #5

## 2019-02-26 MED FILL — HYDROCHLOROTHIAZIDE 25 MG T: 25 | 90 days supply | Qty: 90 | Fill #1

## 2019-03-13 MED FILL — METOPROLOL SUCCINATE ER 100: 100 | 90 days supply | Qty: 135 | Fill #2

## 2019-03-13 MED FILL — metFORMIN HCL ER 500 MG TB2: 500 | 30 days supply | Qty: 60 | Fill #4

## 2019-03-23 MED FILL — TRULICITY 1.5 MG/0.5 ML PEN: 1.5 | 28 days supply | Qty: 2 | Fill #0

## 2019-04-20 ENCOUNTER — Other Ambulatory Visit (HOSPITAL_COMMUNITY): Payer: Self-pay | Admitting: Internal Medicine

## 2019-04-20 MED FILL — AMLODIPINE BESYLATE 10 MG T: 10 | 90 days supply | Qty: 90 | Fill #0

## 2019-04-20 MED FILL — TRULICITY 1.5 MG/0.5 ML PEN: 1.5 | 28 days supply | Qty: 2 | Fill #0

## 2019-04-20 MED FILL — BENAZEPRIL HCL 40 MG TABLET: 40 | 90 days supply | Qty: 90 | Fill #0

## 2019-04-20 MED FILL — metFORMIN HCL ER 500 MG TB2: 500 | 90 days supply | Qty: 180 | Fill #0

## 2019-04-20 MED FILL — ROSUVASTATIN CALCIUM 5 MG T: 5 | 30 days supply | Qty: 30 | Fill #0

## 2019-06-07 MED FILL — TRULICITY 1.5 MG/0.5 ML PEN: 1.5 | 28 days supply | Qty: 2 | Fill #1

## 2019-06-07 MED FILL — ROSUVASTATIN CALCIUM 5 MG T: 5 | 30 days supply | Qty: 30 | Fill #1

## 2019-06-12 ENCOUNTER — Other Ambulatory Visit (HOSPITAL_COMMUNITY): Payer: Self-pay | Admitting: Internal Medicine

## 2019-06-12 MED FILL — MOMETASONE FUROATE 50 MCG S: 50 | 30 days supply | Qty: 17 | Fill #0

## 2019-07-05 MED FILL — HYDROCHLOROTHIAZIDE 25 MG T: 25 | 90 days supply | Qty: 90 | Fill #0

## 2019-07-05 MED FILL — METOPROLOL SUCCINATE ER 100: 100 | 90 days supply | Qty: 135 | Fill #3

## 2019-07-25 MED FILL — TRULICITY 1.5 MG/0.5 ML PEN: 1.5 | 28 days supply | Qty: 2 | Fill #2

## 2019-09-04 MED FILL — metFORMIN HCL ER 500 MG TB2: 500 | 90 days supply | Qty: 180 | Fill #1

## 2019-09-04 MED FILL — TRULICITY 1.5 MG/0.5 ML PEN: 1.5 | 28 days supply | Qty: 2 | Fill #3

## 2019-10-11 MED FILL — TRULICITY 1.5 MG/0.5 ML PEN: 1.5 | 28 days supply | Qty: 2 | Fill #4

## 2019-10-16 ENCOUNTER — Other Ambulatory Visit (HOSPITAL_COMMUNITY): Payer: Self-pay | Admitting: Internal Medicine

## 2019-10-16 MED FILL — ROSUVASTATIN CALCIUM 5 MG T: 5 | 90 days supply | Qty: 90 | Fill #0

## 2019-10-30 MED FILL — HYDROCHLOROTHIAZIDE 25 MG T: 25 | 90 days supply | Qty: 90 | Fill #1

## 2019-10-30 MED FILL — METOPROLOL SUCCINATE ER 100: 100 | 90 days supply | Qty: 135 | Fill #0

## 2019-11-16 MED FILL — TRULICITY 1.5 MG/0.5 ML PEN: 1.5 | 84 days supply | Qty: 6 | Fill #0

## 2019-12-04 MED FILL — MOMETASONE FUROATE 50 MCG S: 50 | 30 days supply | Qty: 17 | Fill #1

## 2019-12-19 MED FILL — BENAZEPRIL HCL 40 MG TABLET: 40 | 90 days supply | Qty: 90 | Fill #2

## 2019-12-19 MED FILL — AMLODIPINE BESYLATE 10 MG T: 10 | 90 days supply | Qty: 90 | Fill #2

## 2019-12-19 MED FILL — metFORMIN HCL ER 500 MG TB2: 500 | 90 days supply | Qty: 180 | Fill #2

## 2020-02-25 MED FILL — TRULICITY 1.5 MG/0.5 ML PEN: 1.5 | 84 days supply | Qty: 6 | Fill #1

## 2020-02-25 MED FILL — HYDROCHLOROTHIAZIDE 25 MG T: 25 | 90 days supply | Qty: 90 | Fill #2

## 2020-02-29 MED FILL — metFORMIN HCL ER 500 MG TB2: 500 | 90 days supply | Qty: 180 | Fill #3

## 2020-03-09 MED FILL — METOPROLOL SUCCINATE ER 100: 100 | 90 days supply | Qty: 135 | Fill #1

## 2020-03-09 MED FILL — ROSUVASTATIN CALCIUM 5 MG T: 5 | 90 days supply | Qty: 90 | Fill #1

## 2020-04-18 MED FILL — BENAZEPRIL HCL 40 MG TABLET: 40 | 90 days supply | Qty: 90 | Fill #3

## 2020-04-18 MED FILL — AMLODIPINE BESYLATE 10 MG T: 10 | 90 days supply | Qty: 90 | Fill #3

## 2020-04-25 ENCOUNTER — Other Ambulatory Visit (HOSPITAL_COMMUNITY): Payer: Self-pay | Admitting: Obstetrics & Gynecology

## 2020-05-12 ENCOUNTER — Other Ambulatory Visit: Payer: Self-pay

## 2020-05-12 ENCOUNTER — Other Ambulatory Visit (HOSPITAL_COMMUNITY): Payer: Self-pay | Admitting: Internal Medicine

## 2020-05-12 ENCOUNTER — Ambulatory Visit (INDEPENDENT_AMBULATORY_CARE_PROVIDER_SITE_OTHER): Payer: No Typology Code available for payment source

## 2020-05-12 ENCOUNTER — Other Ambulatory Visit: Payer: Self-pay | Admitting: Podiatry

## 2020-05-12 ENCOUNTER — Ambulatory Visit (INDEPENDENT_AMBULATORY_CARE_PROVIDER_SITE_OTHER): Payer: No Typology Code available for payment source | Admitting: Podiatry

## 2020-05-12 DIAGNOSIS — S9002XA Contusion of left ankle, initial encounter: Secondary | ICD-10-CM

## 2020-05-12 DIAGNOSIS — M659 Synovitis and tenosynovitis, unspecified: Secondary | ICD-10-CM

## 2020-05-12 MED ORDER — METHYLPREDNISOLONE 4 MG PO TBPK: ORAL_TABLET | ORAL | 0 refills | Status: DC

## 2020-05-12 MED ORDER — BETAMETHASONE SOD PHOS & ACET 6 (3-3) MG/ML IJ SUSP
3.0000 mg | Freq: Once | INTRAMUSCULAR | Status: AC
  Administered 2020-05-12: 3 mg via INTRA_ARTICULAR

## 2020-05-12 MED FILL — TRULICITY 1.5 MG/0.5 ML PEN: 1.5 | 84 days supply | Qty: 6 | Fill #0

## 2020-05-12 MED FILL — METHYLPREDNISOLONE 4 MG TBP: 4 | 6 days supply | Qty: 21 | Fill #0

## 2020-05-12 NOTE — Progress Notes (Signed)
   Subjective:  60 y.o. female presenting today as a reestablish new patient for evaluation of left ankle pain has been going on approximately 6 months now.  She denies a history of injury.  Gradual onset.  She has not done anything for treatment.  She presents for further treatment evaluation   Past Medical History:  Diagnosis Date  . Anemia   . Diabetes mellitus without complication ([facility])   . HTN (hypertension)   . Hypercholesteremia   . Obesity      Objective / Physical Exam:  General:  The patient is alert and oriented x3 in no acute distress. Dermatology:  Skin is warm, dry and supple bilateral lower extremities. Negative for open lesions or macerations. Vascular:  Palpable pedal pulses bilaterally. No edema or erythema noted. Capillary refill within normal limits. Neurological:  Epicritic and protective threshold grossly intact bilaterally.  Musculoskeletal Exam:  Pain on palpation to the anterior lateral medial aspects of the patient's left ankle. Mild edema noted. Range of motion within normal limits to all pedal and ankle joints bilateral. Muscle strength 5/5 in all groups bilateral.   Radiographic Exam:  Normal osseous mineralization. Joint spaces preserved. No fracture/dislocation/boney destruction.    Assessment: 1.  Synovitis/DJD left ankle lateral aspect  Plan of Care:  1. Patient was evaluated. X-Rays reviewed.  2. Injection of 0.5 mL Celestone Soluspan injected in the patient's left ankle. 3.  Prescription for Medrol dose pack 4.  Recommend good supportive shoes 5.  Return to clinic as needed   Felecia Shelling, DPM [facility]  Dr. Felecia Shelling, DPM    [address] [place], [place] [address]                Office [phone]  Fax [phone]

## 2020-05-15 ENCOUNTER — Other Ambulatory Visit (HOSPITAL_COMMUNITY): Payer: Self-pay | Admitting: Internal Medicine

## 2020-05-16 MED FILL — MOMETASONE FUROATE 50 MCG S: 50 | 30 days supply | Qty: 17 | Fill #2

## 2020-05-16 MED FILL — FARXIGA 10 MG TABLET: 10 | 90 days supply | Qty: 90 | Fill #0

## 2020-05-27 ENCOUNTER — Other Ambulatory Visit (HOSPITAL_COMMUNITY): Payer: Self-pay | Admitting: Internal Medicine

## 2020-05-27 MED FILL — FREESTYLE LITE TEST STRIP: 90 days supply | Qty: 100 | Fill #0

## 2020-05-29 ENCOUNTER — Other Ambulatory Visit (HOSPITAL_COMMUNITY): Payer: Self-pay | Admitting: Internal Medicine

## 2020-05-29 MED FILL — CYANOCOBALAMIN 1,000 MCG/ML: 1000 | 90 days supply | Qty: 6 | Fill #0

## 2020-06-30 ENCOUNTER — Other Ambulatory Visit (HOSPITAL_COMMUNITY): Payer: Self-pay

## 2020-07-02 ENCOUNTER — Other Ambulatory Visit (HOSPITAL_COMMUNITY): Payer: Self-pay

## 2020-07-03 ENCOUNTER — Other Ambulatory Visit (HOSPITAL_COMMUNITY): Payer: Self-pay

## 2020-07-03 MED ORDER — HYDROCHLOROTHIAZIDE 25 MG PO TABS
25.0000 mg | ORAL_TABLET | Freq: Every day | ORAL | 0 refills | Status: DC
  Filled 2020-07-03: qty 90, 90d supply, fill #0

## 2020-07-03 MED ORDER — METOPROLOL SUCCINATE ER 100 MG PO TB24
ORAL_TABLET | ORAL | 0 refills | Status: DC
  Filled 2020-07-03: qty 135, 90d supply, fill #0

## 2020-07-05 ENCOUNTER — Other Ambulatory Visit (HOSPITAL_COMMUNITY): Payer: Self-pay

## 2020-07-28 ENCOUNTER — Other Ambulatory Visit (HOSPITAL_COMMUNITY): Payer: Self-pay

## 2020-07-28 MED ORDER — BENAZEPRIL HCL 40 MG PO TABS
ORAL_TABLET | ORAL | 3 refills | Status: DC
  Filled 2020-07-28: qty 90, 90d supply, fill #0
  Filled 2020-11-20: qty 90, 90d supply, fill #1
  Filled 2021-03-18: qty 90, 90d supply, fill #2
  Filled 2021-07-08: qty 90, 90d supply, fill #3

## 2020-07-28 MED ORDER — AMLODIPINE BESYLATE 10 MG PO TABS
ORAL_TABLET | ORAL | 3 refills | Status: AC
  Filled 2020-07-28: qty 90, 90d supply, fill #0
  Filled 2020-11-20: qty 90, 90d supply, fill #1
  Filled 2021-03-18: qty 90, 90d supply, fill #2
  Filled 2021-07-08: qty 90, 90d supply, fill #3

## 2020-07-30 ENCOUNTER — Other Ambulatory Visit (HOSPITAL_COMMUNITY): Payer: Self-pay

## 2020-07-30 MED ORDER — CYANOCOBALAMIN 1000 MCG/ML IJ SOLN
INTRAMUSCULAR | 1 refills | Status: AC
  Filled 2020-07-30: qty 3, 90d supply, fill #0

## 2020-08-12 ENCOUNTER — Other Ambulatory Visit (HOSPITAL_COMMUNITY): Payer: Self-pay

## 2020-08-12 MED ORDER — ROSUVASTATIN CALCIUM 5 MG PO TABS
5.0000 mg | ORAL_TABLET | Freq: Every day | ORAL | 0 refills | Status: DC
  Filled 2020-08-12: qty 90, 90d supply, fill #0

## 2020-08-12 MED ORDER — METFORMIN HCL ER 500 MG PO TB24
1000.0000 mg | ORAL_TABLET | Freq: Every day | ORAL | 3 refills | Status: DC
  Filled 2020-08-12: qty 180, 90d supply, fill #0
  Filled 2020-12-09: qty 180, 90d supply, fill #1
  Filled 2021-04-07: qty 180, 90d supply, fill #2
  Filled 2021-08-07: qty 180, 90d supply, fill #3

## 2020-08-12 MED FILL — Dulaglutide Soln Auto-injector 1.5 MG/0.5ML: SUBCUTANEOUS | 84 days supply | Qty: 6 | Fill #0 | Status: AC

## 2020-08-12 MED FILL — Cyanocobalamin Inj 1000 MCG/ML: INTRAMUSCULAR | 84 days supply | Qty: 7 | Fill #0 | Status: AC

## 2020-08-15 ENCOUNTER — Other Ambulatory Visit (HOSPITAL_COMMUNITY): Payer: Self-pay

## 2020-08-28 ENCOUNTER — Other Ambulatory Visit (HOSPITAL_COMMUNITY): Payer: Self-pay

## 2020-08-28 MED FILL — Dapagliflozin Propanediol Tab 10 MG (Base Equivalent): ORAL | 90 days supply | Qty: 90 | Fill #0 | Status: AC

## 2020-09-10 ENCOUNTER — Other Ambulatory Visit (HOSPITAL_BASED_OUTPATIENT_CLINIC_OR_DEPARTMENT_OTHER): Payer: Self-pay

## 2020-09-10 MED ORDER — CARESTART COVID-19 HOME TEST VI KIT
PACK | 0 refills | Status: DC
  Filled 2020-09-10: qty 2, 4d supply, fill #0

## 2020-10-17 ENCOUNTER — Other Ambulatory Visit (HOSPITAL_COMMUNITY): Payer: Self-pay

## 2020-10-17 MED ORDER — HYDROCHLOROTHIAZIDE 25 MG PO TABS
25.0000 mg | ORAL_TABLET | Freq: Every day | ORAL | 3 refills | Status: AC
  Filled 2020-10-17: qty 90, 90d supply, fill #0
  Filled 2021-02-10: qty 90, 90d supply, fill #1

## 2020-10-17 MED ORDER — ROSUVASTATIN CALCIUM 5 MG PO TABS
5.0000 mg | ORAL_TABLET | Freq: Every day | ORAL | 3 refills | Status: DC
  Filled 2020-10-17: qty 90, 90d supply, fill #0
  Filled 2021-06-24: qty 90, 90d supply, fill #1

## 2020-10-17 MED ORDER — METOPROLOL SUCCINATE ER 100 MG PO TB24
150.0000 mg | ORAL_TABLET | Freq: Every day | ORAL | 3 refills | Status: DC
  Filled 2020-10-17: qty 135, 90d supply, fill #0
  Filled 2021-02-10: qty 135, 90d supply, fill #1
  Filled 2021-05-21: qty 135, 90d supply, fill #2
  Filled 2021-09-28: qty 135, 90d supply, fill #3

## 2020-10-20 ENCOUNTER — Other Ambulatory Visit (HOSPITAL_COMMUNITY): Payer: Self-pay

## 2020-10-21 ENCOUNTER — Other Ambulatory Visit (HOSPITAL_COMMUNITY): Payer: Self-pay

## 2020-10-22 ENCOUNTER — Other Ambulatory Visit (HOSPITAL_COMMUNITY): Payer: Self-pay

## 2020-10-22 MED ORDER — FARXIGA 10 MG PO TABS
10.0000 mg | ORAL_TABLET | Freq: Every day | ORAL | 1 refills | Status: DC
  Filled 2020-10-22 – 2020-12-29 (×2): qty 90, 90d supply, fill #0

## 2020-10-22 MED ORDER — FARXIGA 10 MG PO TABS
10.0000 mg | ORAL_TABLET | Freq: Every day | ORAL | 1 refills | Status: DC
  Filled 2020-10-22: qty 90, 90d supply, fill #0

## 2020-10-23 ENCOUNTER — Other Ambulatory Visit (HOSPITAL_COMMUNITY): Payer: Self-pay

## 2020-10-24 ENCOUNTER — Other Ambulatory Visit (HOSPITAL_COMMUNITY): Payer: Self-pay

## 2020-10-24 MED ORDER — CYANOCOBALAMIN 1000 MCG/ML IJ SOLN
INTRAMUSCULAR | 1 refills | Status: DC
  Filled 2020-10-24: qty 3, 90d supply, fill #0

## 2020-10-25 ENCOUNTER — Other Ambulatory Visit (HOSPITAL_COMMUNITY): Payer: Self-pay

## 2020-10-27 ENCOUNTER — Other Ambulatory Visit (HOSPITAL_COMMUNITY): Payer: Self-pay

## 2020-11-20 ENCOUNTER — Other Ambulatory Visit (HOSPITAL_COMMUNITY): Payer: Self-pay

## 2020-12-09 ENCOUNTER — Other Ambulatory Visit (HOSPITAL_COMMUNITY): Payer: Self-pay

## 2020-12-15 ENCOUNTER — Other Ambulatory Visit (HOSPITAL_COMMUNITY): Payer: Self-pay

## 2020-12-15 MED FILL — Dulaglutide Soln Auto-injector 1.5 MG/0.5ML: SUBCUTANEOUS | 84 days supply | Qty: 6 | Fill #1 | Status: AC

## 2020-12-29 ENCOUNTER — Other Ambulatory Visit (HOSPITAL_COMMUNITY): Payer: Self-pay

## 2021-02-10 ENCOUNTER — Other Ambulatory Visit (HOSPITAL_COMMUNITY): Payer: Self-pay

## 2021-03-11 ENCOUNTER — Other Ambulatory Visit (HOSPITAL_COMMUNITY): Payer: Self-pay

## 2021-03-11 MED ORDER — CLINDAMYCIN HCL 150 MG PO CAPS
ORAL_CAPSULE | ORAL | 0 refills | Status: DC
  Filled 2021-03-11: qty 4, 1d supply, fill #0

## 2021-03-12 ENCOUNTER — Other Ambulatory Visit (HOSPITAL_COMMUNITY): Payer: Self-pay

## 2021-03-12 MED FILL — Dulaglutide Soln Auto-injector 1.5 MG/0.5ML: SUBCUTANEOUS | 84 days supply | Qty: 6 | Fill #2 | Status: AC

## 2021-03-13 ENCOUNTER — Other Ambulatory Visit (HOSPITAL_COMMUNITY): Payer: Self-pay

## 2021-03-13 MED ORDER — HYDROCODONE-ACETAMINOPHEN 10-325 MG PO TABS
ORAL_TABLET | ORAL | 0 refills | Status: DC
  Filled 2021-03-13: qty 15, 4d supply, fill #0

## 2021-03-13 MED ORDER — AMOXICILLIN 500 MG PO CAPS
ORAL_CAPSULE | ORAL | 0 refills | Status: DC
  Filled 2021-03-13: qty 15, 5d supply, fill #0

## 2021-03-18 ENCOUNTER — Other Ambulatory Visit (HOSPITAL_COMMUNITY): Payer: Self-pay

## 2021-04-07 ENCOUNTER — Other Ambulatory Visit (HOSPITAL_COMMUNITY): Payer: Self-pay

## 2021-04-29 ENCOUNTER — Other Ambulatory Visit (HOSPITAL_COMMUNITY): Payer: Self-pay

## 2021-04-29 MED ORDER — HYDROCHLOROTHIAZIDE 25 MG PO TABS
ORAL_TABLET | ORAL | 4 refills | Status: DC
  Filled 2021-04-29 – 2021-05-21 (×2): qty 90, 90d supply, fill #0

## 2021-04-29 MED ORDER — AMLODIPINE BESYLATE 10 MG PO TABS
10.0000 mg | ORAL_TABLET | Freq: Every day | ORAL | 4 refills | Status: DC
  Filled 2021-04-29 – 2022-02-22 (×2): qty 90, 90d supply, fill #0

## 2021-04-29 MED ORDER — BENAZEPRIL HCL 40 MG PO TABS
40.0000 mg | ORAL_TABLET | Freq: Every day | ORAL | 4 refills | Status: DC
  Filled 2021-04-29 – 2022-02-22 (×2): qty 90, 90d supply, fill #0

## 2021-04-29 MED ORDER — METFORMIN HCL ER 500 MG PO TB24
1000.0000 mg | ORAL_TABLET | Freq: Every day | ORAL | 3 refills | Status: DC
  Filled 2021-04-29 – 2022-02-22 (×2): qty 180, 90d supply, fill #0

## 2021-04-29 MED ORDER — TRULICITY 1.5 MG/0.5ML ~~LOC~~ SOAJ
SUBCUTANEOUS | 11 refills | Status: DC
  Filled 2021-04-29: qty 2, 28d supply, fill #0
  Filled 2021-06-24: qty 6, 84d supply, fill #0
  Filled 2021-09-16: qty 6, 84d supply, fill #1
  Filled 2022-01-12: qty 6, 84d supply, fill #2
  Filled 2022-04-15: qty 2, 28d supply, fill #3

## 2021-04-29 MED ORDER — FARXIGA 5 MG PO TABS
5.0000 mg | ORAL_TABLET | Freq: Every day | ORAL | 3 refills | Status: AC
  Filled 2021-04-29 – 2021-05-21 (×2): qty 90, 90d supply, fill #0
  Filled 2021-09-16: qty 90, 90d supply, fill #1
  Filled 2022-01-12: qty 90, 90d supply, fill #2

## 2021-04-29 MED ORDER — CYANOCOBALAMIN 1000 MCG/ML IJ SOLN
INTRAMUSCULAR | 1 refills | Status: DC
Start: 2021-04-29 — End: 2022-05-27
  Filled 2021-04-29: qty 3, 90d supply, fill #0

## 2021-04-29 MED ORDER — ROSUVASTATIN CALCIUM 5 MG PO TABS
ORAL_TABLET | ORAL | 3 refills | Status: DC
  Filled 2021-04-29: qty 90, 90d supply, fill #0

## 2021-04-30 ENCOUNTER — Other Ambulatory Visit (HOSPITAL_COMMUNITY): Payer: Self-pay

## 2021-05-07 ENCOUNTER — Other Ambulatory Visit (HOSPITAL_COMMUNITY): Payer: Self-pay

## 2021-05-21 ENCOUNTER — Other Ambulatory Visit (HOSPITAL_COMMUNITY): Payer: Self-pay

## 2021-05-22 ENCOUNTER — Other Ambulatory Visit (HOSPITAL_COMMUNITY): Payer: Self-pay

## 2021-06-24 ENCOUNTER — Other Ambulatory Visit (HOSPITAL_COMMUNITY): Payer: Self-pay

## 2021-06-25 ENCOUNTER — Other Ambulatory Visit (HOSPITAL_COMMUNITY): Payer: Self-pay

## 2021-07-08 ENCOUNTER — Other Ambulatory Visit (HOSPITAL_COMMUNITY): Payer: Self-pay

## 2021-08-07 ENCOUNTER — Other Ambulatory Visit (HOSPITAL_COMMUNITY): Payer: Self-pay

## 2021-09-15 ENCOUNTER — Other Ambulatory Visit (HOSPITAL_COMMUNITY): Payer: Self-pay

## 2021-09-15 MED ORDER — HYDROCHLOROTHIAZIDE 25 MG PO TABS
ORAL_TABLET | ORAL | 4 refills | Status: DC
  Filled 2021-09-15: qty 90, 90d supply, fill #0
  Filled 2022-01-12: qty 90, 90d supply, fill #1

## 2021-09-15 MED ORDER — BENAZEPRIL HCL 40 MG PO TABS
ORAL_TABLET | ORAL | 4 refills | Status: DC
  Filled 2021-09-15: qty 90, 90d supply, fill #0

## 2021-09-15 MED ORDER — AMLODIPINE BESYLATE 10 MG PO TABS
ORAL_TABLET | ORAL | 4 refills | Status: DC
  Filled 2021-09-15: qty 90, 90d supply, fill #0

## 2021-09-16 ENCOUNTER — Other Ambulatory Visit (HOSPITAL_COMMUNITY): Payer: Self-pay

## 2021-09-16 MED ORDER — MOMETASONE FUROATE 50 MCG/ACT NA SUSP
NASAL | 4 refills | Status: DC
  Filled 2021-09-16 – 2022-01-12 (×2): qty 17, 30d supply, fill #0

## 2021-09-17 ENCOUNTER — Other Ambulatory Visit (HOSPITAL_COMMUNITY): Payer: Self-pay

## 2021-09-18 ENCOUNTER — Other Ambulatory Visit (HOSPITAL_COMMUNITY): Payer: Self-pay

## 2021-09-22 ENCOUNTER — Other Ambulatory Visit (HOSPITAL_COMMUNITY): Payer: Self-pay

## 2021-09-28 ENCOUNTER — Other Ambulatory Visit (HOSPITAL_COMMUNITY): Payer: Self-pay

## 2021-09-30 NOTE — Progress Notes (Unsigned)
Zach Avinash [place] [address] [place] Phone: [phone] Subjective:   IVilma Meckel, am serving as a scribe for Dr. Hulan Saas.  I'm seeing this patient by the request  of:  Leeroy Cha, MD  CC: right elbow pain   DUK:[id number]  Miranda Hernandez is a 61 y.o. female coming in with complaint of right elbow pain. Pain in right elbow medial side, but getting better. Sometimes when lifting right arm shoulder goes weak. Still taking Vit D.       Past Medical History:  Diagnosis Date   Anemia    Diabetes mellitus without complication (HCC)    HTN (hypertension)    Hypercholesteremia    Obesity    Past Surgical History:  Procedure Laterality Date   ABDOMINAL HYSTERECTOMY     CESAREAN SECTION     GASTRIC BYPASS     Social History   Socioeconomic History   Marital status: Married    Spouse name: Not on file   Number of children: Not on file   Years of education: Not on file   Highest education level: Not on file  Occupational History   Not on file  Tobacco Use   Smoking status: Never   Smokeless tobacco: Never  Substance and Sexual Activity   Alcohol use: Yes    Comment: occ. wine   Drug use: No   Sexual activity: Yes    Birth control/protection: Surgical    Comment: Hysterectomy  Other Topics Concern   Not on file  Social History Narrative   Not on file   Social Determinants of Health   Financial Resource Strain: Not on file  Food Insecurity: Not on file  Transportation Needs: Not on file  Physical Activity: Not on file  Stress: Not on file  Social Connections: Not on file   No Known Allergies Family History  Problem Relation Age of Onset   Diabetes Mother    Heart disease Mother    Stroke Mother    Hypertension Mother    Heart disease Father    Hypertension Father    Arthritis Neg Hx    Colon cancer Neg Hx     Current Outpatient Medications (Endocrine & Metabolic):    dapagliflozin  propanediol (FARXIGA) 10 MG TABS tablet, TAKE 1 TABLET BY MOUTH ONCE A DAY   dapagliflozin propanediol (FARXIGA) 10 MG TABS tablet, TAKE 1 TABLET BY MOUTH ONCE A DAY   dapagliflozin propanediol (FARXIGA) 5 MG TABS tablet, Take 1 tablet (5 mg total) by mouth daily.   Dulaglutide (TRULICITY) 1.5 VO/3.6GO SOPN, Inject 1.5 mg (1 pen) into the skin once a week as directed   metFORMIN (GLUCOPHAGE) 1000 MG tablet, Take 1,000 mg by mouth daily.    metFORMIN (GLUCOPHAGE-XR) 500 MG 24 hr tablet, Take 2 tablets (1,000 mg total) by mouth daily.   metFORMIN (GLUCOPHAGE-XR) 500 MG 24 hr tablet, Take 2 tablets (1,000 mg total) by mouth daily.   testosterone (ANDROGEL) 50 MG/5GM (1%) GEL, APPLY 1/2 GRAM TO ARM,LEGS AND ABDOMEN ONCE A DAY  Current Outpatient Medications (Cardiovascular):    amLODipine (NORVASC) 10 MG tablet, TAKE 1 TABLET BY MOUTH ONCE DAILY   amLODipine (NORVASC) 10 MG tablet, Take 1 tablet by mouth once a day   amLODipine (NORVASC) 10 MG tablet, Take 1 tablet (10 mg total) by mouth daily.   amLODipine (NORVASC) 10 MG tablet, Take 1 tablet by mouth once daily.   amLODipine-benazepril (LOTREL) 10-40 MG per capsule,  Take 1 capsule by mouth daily.   amLODipine-benazepril (LOTREL) 10-40 MG per capsule, TAKE 1 CAPSULE BY MOUTH ONCE DAILY   benazepril (LOTENSIN) 40 MG tablet, TAKE 1 TABLET BY MOUTH ONCE DAILY   benazepril (LOTENSIN) 40 MG tablet, Take 1 tablet by mouth once a day   benazepril (LOTENSIN) 40 MG tablet, Take 1 tablet (40 mg total) by mouth daily.   benazepril (LOTENSIN) 40 MG tablet, Take 1 tablet by mouth once a day.   hydrochlorothiazide (HYDRODIURIL) 25 MG tablet, Take 25 mg by mouth daily.   hydrochlorothiazide (HYDRODIURIL) 25 MG tablet, Take 1 tablet by mouth daily.   hydrochlorothiazide (HYDRODIURIL) 25 MG tablet, Take 1 tablet (25 mg total) by mouth daily   hydrochlorothiazide (HYDRODIURIL) 25 MG tablet, Take 1 tablet (25 mg total) by mouth daily.   metoprolol (LOPRESSOR) 100  MG tablet, Take 150 mg daily   metoprolol succinate (TOPROL-XL) 100 MG 24 hr tablet, TAKE 1 & 1/2 TABLETS BY MOUTH DAILY   metoprolol succinate (TOPROL-XL) 100 MG 24 hr tablet, TAKE 1 AND 1/2 TABLETS BY MOUTH ONCE DAILY   metoprolol succinate (TOPROL-XL) 100 MG 24 hr tablet, Take 1 and 1/2 tablets by mouth daily   rosuvastatin (CRESTOR) 5 MG tablet, Take 1 tablet by mouth daily.   rosuvastatin (CRESTOR) 5 MG tablet, Take 1 tablet (5 mg total) by mouth daily  Current Outpatient Medications (Respiratory):    mometasone (NASONEX) 50 MCG/ACT nasal spray, Place 2 sprays into the nose daily.   mometasone (NASONEX) 50 MCG/ACT nasal spray, PLACE 2 SPRAYS IN EACH NOSTRIL ONCE A DAY   mometasone (NASONEX) 50 MCG/ACT nasal spray, Place 2 sprays into each nostril daily as directed  Current Outpatient Medications (Analgesics):    HYDROcodone-acetaminophen (NORCO) 10-325 MG tablet, Take 1 tablet by mouth every 6 hours as needed for pain   ibuprofen (ADVIL,MOTRIN) 600 MG tablet, Take 1 tablet (600 mg total) by mouth every 8 (eight) hours as needed.  Current Outpatient Medications (Hematological):    cyanocobalamin (,VITAMIN B-12,) 1000 MCG/ML injection, Inject 1 ml intramuscularly once a month as directed   cyanocobalamin (,VITAMIN B-12,) 1000 MCG/ML injection, Inject 1 ml as directed once a month   cyanocobalamin (,VITAMIN B-12,) 1000 MCG/ML injection, Inject 1 ml once a month as directed   ferrous sulfate 325 (65 FE) MG tablet, Take 325 mg by mouth daily with breakfast.  Current Outpatient Medications (Other):    gabapentin (NEURONTIN) 100 MG capsule, Take 2 capsules (200 mg total) by mouth at bedtime.   amoxicillin (AMOXIL) 500 MG capsule, Take 1 capsule by mouth 3 times a day until gone.   clindamycin (CLEOCIN) 150 MG capsule, Take 4 capsules by mouth 1 hour prior to surgery.   COVID-19 At Home Antigen Test [facility] COVID-19 HOME TEST) KIT, Use as directed within package instructions   docusate  sodium (COLACE) 100 MG capsule, Take 100 mg by mouth 2 (two) times daily.   Vitamin D, Ergocalciferol, (DRISDOL) 1.25 MG (50000 UT) CAPS capsule, TAKE 1 CAPSULE (50,000 UNITS TOTAL) BY MOUTH EVERY 7 (SEVEN) DAYS.   Reviewed prior external information including notes and imaging from  primary care provider As well as notes that were available from care everywhere and other healthcare systems.  Past medical history, social, surgical and family history all reviewed in electronic medical record.  No pertanent information unless stated regarding to the chief complaint.   Review of Systems:  No headache, visual changes, nausea, vomiting, diarrhea, constipation, dizziness, abdominal pain, skin rash,  fevers, chills, night sweats, weight loss, swollen lymph nodes, body aches, joint swelling, chest pain, shortness of breath, mood changes. POSITIVE muscle aches  Objective  Blood pressure 104/74, pulse 79, height _0  (1.651 m), SpO2 98 %.   General: No apparent distress alert and oriented x3 mood and affect normal, dressed appropriately.  HEENT: Pupils equal, extraocular movements intact  Respiratory: Patient's speak in full sentences and does not appear short of breath  Cardiovascular: No lower extremity edema, non tender, no erythema  Right elbow ttp in the medial aspect, positive tinel.  Patient does have full range of motion though noted.  Patient's shoulder does have very mild positive impingement.  Negative Spurling's noted noted.   Limited muscular skeletal ultrasound was performed and interpreted by Hulan Saas, M  Limited ultrasound of patient's elbow shows no significant hypoechoic changes of the flexor, and tendon sheath.  Patient does have significant dilatation of the ulnar nerve with some mild subluxation occurring. Impression: Ulnar subluxation at the elbow   Impression and Recommendations:    The above documentation has been reviewed and is accurate and complete Lyndal Pulley, DO

## 2021-10-01 ENCOUNTER — Ambulatory Visit: Payer: No Typology Code available for payment source | Admitting: Family Medicine

## 2021-10-01 ENCOUNTER — Ambulatory Visit: Payer: Self-pay

## 2021-10-01 ENCOUNTER — Other Ambulatory Visit (HOSPITAL_COMMUNITY): Payer: Self-pay

## 2021-10-01 ENCOUNTER — Encounter: Payer: Self-pay | Admitting: Family Medicine

## 2021-10-01 ENCOUNTER — Ambulatory Visit (INDEPENDENT_AMBULATORY_CARE_PROVIDER_SITE_OTHER): Payer: No Typology Code available for payment source

## 2021-10-01 ENCOUNTER — Telehealth: Payer: Self-pay

## 2021-10-01 VITALS — BP 104/74 | HR 79 | Ht 65.0 in

## 2021-10-01 DIAGNOSIS — M25521 Pain in right elbow: Secondary | ICD-10-CM | POA: Diagnosis not present

## 2021-10-01 DIAGNOSIS — G5621 Lesion of ulnar nerve, right upper limb: Secondary | ICD-10-CM | POA: Diagnosis not present

## 2021-10-01 DIAGNOSIS — M2550 Pain in unspecified joint: Secondary | ICD-10-CM | POA: Diagnosis not present

## 2021-10-01 LAB — CBC WITH DIFFERENTIAL/PLATELET
Basophils Absolute: 0 10*3/uL (ref 0.0–0.1)
Basophils Relative: 0.8 % (ref 0.0–3.0)
Eosinophils Absolute: 0.1 10*3/uL (ref 0.0–0.7)
Eosinophils Relative: 2.3 % (ref 0.0–5.0)
HCT: 35.4 % — ABNORMAL LOW (ref 36.0–46.0)
Hemoglobin: 11.6 g/dL — ABNORMAL LOW (ref 12.0–15.0)
Lymphocytes Relative: 33.6 % (ref 12.0–46.0)
Lymphs Abs: 2.1 10*3/uL (ref 0.7–4.0)
MCHC: 32.8 g/dL (ref 30.0–36.0)
MCV: 87.4 fl (ref 78.0–100.0)
Monocytes Absolute: 0.4 10*3/uL (ref 0.1–1.0)
Monocytes Relative: 7.1 % (ref 3.0–12.0)
Neutro Abs: 3.6 10*3/uL (ref 1.4–7.7)
Neutrophils Relative %: 56.2 % (ref 43.0–77.0)
Platelets: 213 10*3/uL (ref 150.0–400.0)
RBC: 4.06 Mil/uL (ref 3.87–5.11)
RDW: 15.3 % (ref 11.5–15.5)
WBC: 6.3 10*3/uL (ref 4.0–10.5)

## 2021-10-01 LAB — IBC PANEL
Iron: 39 ug/dL — ABNORMAL LOW (ref 42–145)
Saturation Ratios: 7.2 % — ABNORMAL LOW (ref 20.0–50.0)
TIBC: 543.2 ug/dL — ABNORMAL HIGH (ref 250.0–450.0)
Transferrin: 388 mg/dL — ABNORMAL HIGH (ref 212.0–360.0)

## 2021-10-01 LAB — FERRITIN: Ferritin: 5.1 ng/mL — ABNORMAL LOW (ref 10.0–291.0)

## 2021-10-01 MED ORDER — GABAPENTIN 100 MG PO CAPS
200.0000 mg | ORAL_CAPSULE | Freq: Every day | ORAL | 0 refills | Status: DC
  Filled 2021-10-01: qty 180, 90d supply, fill #0

## 2021-10-01 NOTE — Patient Instructions (Addendum)
Gabapentin prescribed Do prescribed exercises at least 3x a week Xray today Labs today Arnica lotion See you again in 5-6 weeks

## 2021-10-01 NOTE — Telephone Encounter (Signed)
When patient was leaving her appointment she asked if we could make sure that all the lab and x ray results were sent to her PCP and the office visit notes were sent to him too. Her PCP is at [facility]

## 2021-10-01 NOTE — Assessment & Plan Note (Signed)
Medial aspect of the elbow.  X-rays are pending.  Discussed compression and icing regimen, discussed which activities to do which ones to avoid.  Started on gabapentin patient will come back again in 6 weeks.   Worsening pain consider formal physical therapy.

## 2021-10-06 NOTE — Telephone Encounter (Signed)
No, she had gotten al the labs done that day. I was working up front so I just made sure I sent a message so no one would forget.

## 2021-10-06 NOTE — Telephone Encounter (Signed)
Sent to PCP ?

## 2021-10-29 ENCOUNTER — Other Ambulatory Visit (HOSPITAL_COMMUNITY): Payer: Self-pay

## 2021-10-29 MED ORDER — ROSUVASTATIN CALCIUM 5 MG PO TABS
5.0000 mg | ORAL_TABLET | Freq: Every day | ORAL | 3 refills | Status: AC
Start: 2021-10-29 — End: ?
  Filled 2021-10-29: qty 90, 90d supply, fill #0
  Filled 2022-04-15: qty 90, 90d supply, fill #1
  Filled 2022-08-09: qty 90, 90d supply, fill #2

## 2021-11-03 NOTE — Progress Notes (Unsigned)
[place] Phone: [phone] Subjective:   Fontaine No, am serving as a scribe for Dr. Hulan Saas.  I'm seeing this patient by the request  of:  Leeroy Cha, MD  CC: right elbow pain   [id number]  10/01/2021 Medial aspect of the elbow.  X-rays are pending.  Discussed compression and icing regimen, discussed which activities to do which ones to avoid.  Started on gabapentin patient will come back again in 6 weeks.   Worsening pain consider formal physical therapy.  Updated 11/04/2021 Miranda Hernandez is a 61 y.o. female coming in with complaint of right elbow pain. Patient states that she is doing somewhat better but still has pain. Worse when she has elbow bent for a while and moves to extend. No tingling or numbness. Exercise have been helping.        Past Medical History:  Diagnosis Date   Anemia    Diabetes mellitus without complication (HCC)    HTN (hypertension)    Hypercholesteremia    Obesity    Past Surgical History:  Procedure Laterality Date   ABDOMINAL HYSTERECTOMY     CESAREAN SECTION     GASTRIC BYPASS     Social History   Socioeconomic History   Marital status: Married    Spouse name: Not on file   Number of children: Not on file   Years of education: Not on file   Highest education level: Not on file  Occupational History   Not on file  Tobacco Use   Smoking status: Never   Smokeless tobacco: Never  Substance and Sexual Activity   Alcohol use: Yes    Comment: occ. wine   Drug use: No   Sexual activity: Yes    Birth control/protection: Surgical    Comment: Hysterectomy  Other Topics Concern   Not on file  Social History Narrative   Not on file   Social Determinants of Health   Financial Resource Strain: Not on file  Food Insecurity: Not on file  Transportation Needs: Not on file  Physical Activity: Not on file  Stress: Not on file  Social  Connections: Not on file   No Known Allergies Family History  Problem Relation Age of Onset   Diabetes Mother    Heart disease Mother    Stroke Mother    Hypertension Mother    Heart disease Father    Hypertension Father    Arthritis Neg Hx    Colon cancer Neg Hx     Current Outpatient Medications (Endocrine & Metabolic):    dapagliflozin propanediol (FARXIGA) 10 MG TABS tablet, TAKE 1 TABLET BY MOUTH ONCE A DAY   dapagliflozin propanediol (FARXIGA) 10 MG TABS tablet, TAKE 1 TABLET BY MOUTH ONCE A DAY   dapagliflozin propanediol (FARXIGA) 5 MG TABS tablet, Take 1 tablet (5 mg total) by mouth daily.   Dulaglutide (TRULICITY) 1.5 MV/3.6PQ SOPN, Inject 1.5 mg (1 pen) into the skin once a week as directed   metFORMIN (GLUCOPHAGE) 1000 MG tablet, Take 1,000 mg by mouth daily.    metFORMIN (GLUCOPHAGE-XR) 500 MG 24 hr tablet, Take 2 tablets (1,000 mg total) by mouth daily.   metFORMIN (GLUCOPHAGE-XR) 500 MG 24 hr tablet, Take 2 tablets (1,000 mg total) by mouth daily.   testosterone (ANDROGEL) 50 MG/5GM (1%) GEL, APPLY 1/2 GRAM TO ARM,LEGS AND ABDOMEN ONCE A DAY  Current Outpatient Medications (Cardiovascular):    amLODipine (NORVASC) 10 MG  tablet, Take 1 tablet by mouth once a day   amLODipine (NORVASC) 10 MG tablet, Take 1 tablet (10 mg total) by mouth daily.   amLODipine (NORVASC) 10 MG tablet, Take 1 tablet by mouth once daily.   amLODipine-benazepril (LOTREL) 10-40 MG per capsule, Take 1 capsule by mouth daily.   amLODipine-benazepril (LOTREL) 10-40 MG per capsule, TAKE 1 CAPSULE BY MOUTH ONCE DAILY   benazepril (LOTENSIN) 40 MG tablet, Take 1 tablet by mouth once a day   benazepril (LOTENSIN) 40 MG tablet, Take 1 tablet (40 mg total) by mouth daily.   benazepril (LOTENSIN) 40 MG tablet, Take 1 tablet by mouth once a day.   hydrochlorothiazide (HYDRODIURIL) 25 MG tablet, Take 25 mg by mouth daily.   hydrochlorothiazide (HYDRODIURIL) 25 MG tablet, Take 1 tablet by mouth daily.    hydrochlorothiazide (HYDRODIURIL) 25 MG tablet, Take 1 tablet (25 mg total) by mouth daily   hydrochlorothiazide (HYDRODIURIL) 25 MG tablet, Take 1 tablet (25 mg total) by mouth daily.   metoprolol (LOPRESSOR) 100 MG tablet, Take 150 mg daily   metoprolol succinate (TOPROL-XL) 100 MG 24 hr tablet, TAKE 1 & 1/2 TABLETS BY MOUTH DAILY   metoprolol succinate (TOPROL-XL) 100 MG 24 hr tablet, Take 1 and 1/2 tablets by mouth daily   rosuvastatin (CRESTOR) 5 MG tablet, Take 1 tablet (5 mg total) by mouth daily   rosuvastatin (CRESTOR) 5 MG tablet, Take 1 tablet by mouth daily.   amLODipine (NORVASC) 10 MG tablet, TAKE 1 TABLET BY MOUTH ONCE DAILY   benazepril (LOTENSIN) 40 MG tablet, TAKE 1 TABLET BY MOUTH ONCE DAILY   metoprolol succinate (TOPROL-XL) 100 MG 24 hr tablet, TAKE 1 AND 1/2 TABLETS BY MOUTH ONCE DAILY  Current Outpatient Medications (Respiratory):    mometasone (NASONEX) 50 MCG/ACT nasal spray, Place 2 sprays into the nose daily.   mometasone (NASONEX) 50 MCG/ACT nasal spray, Place 2 sprays into each nostril daily as directed   mometasone (NASONEX) 50 MCG/ACT nasal spray, PLACE 2 SPRAYS IN EACH NOSTRIL ONCE A DAY  Current Outpatient Medications (Analgesics):    HYDROcodone-acetaminophen (NORCO) 10-325 MG tablet, Take 1 tablet by mouth every 6 hours as needed for pain   ibuprofen (ADVIL,MOTRIN) 600 MG tablet, Take 1 tablet (600 mg total) by mouth every 8 (eight) hours as needed.  Current Outpatient Medications (Hematological):    cyanocobalamin (,VITAMIN B-12,) 1000 MCG/ML injection, Inject 1 ml intramuscularly once a month as directed   cyanocobalamin (,VITAMIN B-12,) 1000 MCG/ML injection, Inject 1 ml as directed once a month   cyanocobalamin (,VITAMIN B-12,) 1000 MCG/ML injection, Inject 1 ml once a month as directed   ferrous sulfate 325 (65 FE) MG tablet, Take 325 mg by mouth daily with breakfast.  Current Outpatient Medications (Other):    amoxicillin (AMOXIL) 500 MG capsule,  Take 1 capsule by mouth 3 times a day until gone.   clindamycin (CLEOCIN) 150 MG capsule, Take 4 capsules by mouth 1 hour prior to surgery.   COVID-19 At Home Antigen Test [facility] COVID-19 HOME TEST) KIT, Use as directed within package instructions   docusate sodium (COLACE) 100 MG capsule, Take 100 mg by mouth 2 (two) times daily.   gabapentin (NEURONTIN) 100 MG capsule, Take 2 capsules (200 mg total) by mouth at bedtime.   Vitamin D, Ergocalciferol, (DRISDOL) 1.25 MG (50000 UT) CAPS capsule, TAKE 1 CAPSULE (50,000 UNITS TOTAL) BY MOUTH EVERY 7 (SEVEN) DAYS.     Objective  Blood pressure 96/60, pulse 74, height 5'  5" (1.651 m), weight 213 lb (96.6 kg), SpO2 99 %.   General: No apparent distress alert and oriented x3 mood and affect normal, dressed appropriately.  HEENT: Pupils equal, extraocular movements intact  Respiratory: Patient's speak in full sentences and does not appear short of breath  Cardiovascular: No lower extremity edema, non tender, no erythema  Elbow exam shows patient has good range of motion.  Some pain minorly with resisted flexion. Mildly positive Tinel's   Limited muscular skeletal ultrasound was performed and interpreted by Hulan Saas, M  Lim ultrasound of patient's elbow does show some hypoechoic changes still with some mild dilatation of the ulnar nerve.  Patient does have improvement in the soft tissue changes previously seen.  No irregularity noted at the medial epicondyles Impression: Interval improvement    Impression and Recommendations:    The above documentation has been reviewed and is accurate and complete Lyndal Pulley, DO

## 2021-11-04 ENCOUNTER — Encounter: Payer: Self-pay | Admitting: Family Medicine

## 2021-11-04 ENCOUNTER — Ambulatory Visit: Payer: Self-pay

## 2021-11-04 ENCOUNTER — Ambulatory Visit (INDEPENDENT_AMBULATORY_CARE_PROVIDER_SITE_OTHER): Payer: No Typology Code available for payment source | Admitting: Family Medicine

## 2021-11-04 VITALS — BP 96/60 | HR 74 | Ht 65.0 in | Wt 213.0 lb

## 2021-11-04 DIAGNOSIS — M25521 Pain in right elbow: Secondary | ICD-10-CM

## 2021-11-04 DIAGNOSIS — G5621 Lesion of ulnar nerve, right upper limb: Secondary | ICD-10-CM

## 2021-11-04 NOTE — Patient Instructions (Signed)
Good to see you Making improvement Titrate off gabapentin Continue vitamins See me again in 7-8 weeks

## 2021-11-04 NOTE — Assessment & Plan Note (Signed)
He has made some improvement.  Still think that there is some ulnar neuropathy that is contributing.  No significant weakness in the hand though.  Patient is going to continue with conservative therapy and try to titrate down off of the gabapentin.  We do have room to increase as well.  Follow-up 1 more time in 2 months to hopefully fully release.

## 2021-11-24 ENCOUNTER — Other Ambulatory Visit (HOSPITAL_COMMUNITY): Payer: Self-pay

## 2021-11-25 ENCOUNTER — Other Ambulatory Visit (HOSPITAL_COMMUNITY): Payer: Self-pay

## 2021-11-25 MED ORDER — METFORMIN HCL ER 500 MG PO TB24
1000.0000 mg | ORAL_TABLET | Freq: Every day | ORAL | 3 refills | Status: DC
Start: 2021-11-25 — End: 2022-05-27
  Filled 2021-11-25: qty 180, 90d supply, fill #0

## 2022-01-05 ENCOUNTER — Ambulatory Visit: Payer: No Typology Code available for payment source | Admitting: Family Medicine

## 2022-01-12 ENCOUNTER — Other Ambulatory Visit (HOSPITAL_COMMUNITY): Payer: Self-pay

## 2022-01-13 ENCOUNTER — Other Ambulatory Visit (HOSPITAL_COMMUNITY): Payer: Self-pay

## 2022-01-13 MED ORDER — METOPROLOL SUCCINATE ER 100 MG PO TB24
150.0000 mg | ORAL_TABLET | Freq: Every day | ORAL | 1 refills | Status: DC
Start: 2022-01-13 — End: 2022-05-13
  Filled 2022-01-13: qty 135, 90d supply, fill #0
  Filled 2022-04-09: qty 135, 90d supply, fill #1

## 2022-01-14 ENCOUNTER — Other Ambulatory Visit (HOSPITAL_COMMUNITY): Payer: Self-pay

## 2022-02-22 ENCOUNTER — Other Ambulatory Visit (HOSPITAL_COMMUNITY): Payer: Self-pay

## 2022-02-24 ENCOUNTER — Other Ambulatory Visit (HOSPITAL_COMMUNITY): Payer: Self-pay

## 2022-04-15 ENCOUNTER — Other Ambulatory Visit (HOSPITAL_COMMUNITY): Payer: Self-pay

## 2022-04-16 ENCOUNTER — Other Ambulatory Visit (HOSPITAL_COMMUNITY): Payer: Self-pay

## 2022-04-30 DIAGNOSIS — Z01411 Encounter for gynecological examination (general) (routine) with abnormal findings: Secondary | ICD-10-CM | POA: Diagnosis not present

## 2022-04-30 DIAGNOSIS — Z1231 Encounter for screening mammogram for malignant neoplasm of breast: Secondary | ICD-10-CM | POA: Diagnosis not present

## 2022-04-30 DIAGNOSIS — R87613 High grade squamous intraepithelial lesion on cytologic smear of cervix (HGSIL): Secondary | ICD-10-CM | POA: Diagnosis not present

## 2022-04-30 DIAGNOSIS — Z6834 Body mass index (BMI) 34.0-34.9, adult: Secondary | ICD-10-CM | POA: Diagnosis not present

## 2022-04-30 DIAGNOSIS — E119 Type 2 diabetes mellitus without complications: Secondary | ICD-10-CM | POA: Diagnosis not present

## 2022-04-30 DIAGNOSIS — R6882 Decreased libido: Secondary | ICD-10-CM | POA: Diagnosis not present

## 2022-05-04 ENCOUNTER — Other Ambulatory Visit (HOSPITAL_COMMUNITY): Payer: Self-pay

## 2022-05-12 ENCOUNTER — Other Ambulatory Visit: Payer: Self-pay | Admitting: Internal Medicine

## 2022-05-12 DIAGNOSIS — E785 Hyperlipidemia, unspecified: Secondary | ICD-10-CM

## 2022-05-12 DIAGNOSIS — E7800 Pure hypercholesterolemia, unspecified: Secondary | ICD-10-CM

## 2022-05-12 DIAGNOSIS — I83893 Varicose veins of bilateral lower extremities with other complications: Secondary | ICD-10-CM | POA: Diagnosis not present

## 2022-05-12 DIAGNOSIS — E1169 Type 2 diabetes mellitus with other specified complication: Secondary | ICD-10-CM | POA: Diagnosis not present

## 2022-05-12 DIAGNOSIS — H47012 Ischemic optic neuropathy, left eye: Secondary | ICD-10-CM | POA: Diagnosis not present

## 2022-05-12 DIAGNOSIS — I10 Essential (primary) hypertension: Secondary | ICD-10-CM | POA: Diagnosis not present

## 2022-05-12 DIAGNOSIS — Z1211 Encounter for screening for malignant neoplasm of colon: Secondary | ICD-10-CM | POA: Diagnosis not present

## 2022-05-12 DIAGNOSIS — Z0000 Encounter for general adult medical examination without abnormal findings: Secondary | ICD-10-CM | POA: Diagnosis not present

## 2022-05-12 DIAGNOSIS — Z1231 Encounter for screening mammogram for malignant neoplasm of breast: Secondary | ICD-10-CM | POA: Diagnosis not present

## 2022-05-12 DIAGNOSIS — R002 Palpitations: Secondary | ICD-10-CM | POA: Diagnosis not present

## 2022-05-13 ENCOUNTER — Other Ambulatory Visit (HOSPITAL_COMMUNITY): Payer: Self-pay

## 2022-05-13 ENCOUNTER — Other Ambulatory Visit: Payer: Self-pay

## 2022-05-13 MED ORDER — METOPROLOL SUCCINATE ER 100 MG PO TB24: 150.0000 mg | ORAL_TABLET | Freq: Every day | ORAL | 4 refills | Status: DC

## 2022-05-13 MED ORDER — TRULICITY 1.5 MG/0.5ML ~~LOC~~ SOAJ
1.5000 mg | SUBCUTANEOUS | 4 refills | Status: AC
  Filled 2022-05-13: qty 2, 28d supply, fill #0
  Filled 2022-06-18 (×2): qty 2, 28d supply, fill #1
  Filled 2022-07-11: qty 2, 28d supply, fill #2
  Filled 2022-08-09: qty 2, 28d supply, fill #3
  Filled 2022-09-14: qty 2, 28d supply, fill #4
  Filled 2022-10-07: qty 2, 28d supply, fill #5
  Filled 2022-11-16: qty 2, 28d supply, fill #6
  Filled 2022-12-07 – 2022-12-13 (×2): qty 2, 28d supply, fill #7
  Filled 2023-01-07: qty 2, 28d supply, fill #8
  Filled 2023-02-07: qty 2, 28d supply, fill #9
  Filled 2023-03-17: qty 2, 28d supply, fill #10
  Filled 2023-04-19: qty 2, 28d supply, fill #11

## 2022-05-13 MED ORDER — DAPAGLIFLOZIN PROPANEDIOL 5 MG PO TABS
5.0000 mg | ORAL_TABLET | Freq: Every day | ORAL | 3 refills | Status: AC
Start: 2022-05-12 — End: ?
  Filled 2022-05-13 – 2022-05-14 (×2): qty 90, 90d supply, fill #0
  Filled 2022-10-15: qty 90, 90d supply, fill #1
  Filled 2023-02-07: qty 90, 90d supply, fill #2

## 2022-05-13 MED ORDER — AMLODIPINE BESYLATE 10 MG PO TABS
10.0000 mg | ORAL_TABLET | Freq: Every day | ORAL | 4 refills | Status: DC
Start: 2022-05-12 — End: 2022-05-27

## 2022-05-13 MED ORDER — METFORMIN HCL ER 500 MG PO TB24: 1000.0000 mg | ORAL_TABLET | Freq: Every evening | ORAL | 4 refills | Status: DC

## 2022-05-13 MED ORDER — HYDROCHLOROTHIAZIDE 25 MG PO TABS
25.0000 mg | ORAL_TABLET | Freq: Every day | ORAL | 4 refills | Status: DC
  Filled 2022-05-13: qty 90, 90d supply, fill #0

## 2022-05-13 MED ORDER — INSULIN PEN NEEDLE 32G X 4 MM MISC
3 refills | Status: DC
  Filled 2022-05-13: qty 100, 90d supply, fill #0

## 2022-05-13 MED ORDER — BENAZEPRIL HCL 40 MG PO TABS
40.0000 mg | ORAL_TABLET | Freq: Every day | ORAL | 4 refills | Status: DC
  Filled 2022-05-13: qty 90, 90d supply, fill #0
  Filled 2022-10-15: qty 90, 90d supply, fill #1
  Filled 2023-02-07: qty 90, 90d supply, fill #2

## 2022-05-13 MED ORDER — ROSUVASTATIN CALCIUM 5 MG PO TABS: 5.0000 mg | ORAL_TABLET | Freq: Every day | ORAL | 4 refills | Status: DC

## 2022-05-14 ENCOUNTER — Other Ambulatory Visit: Payer: Self-pay

## 2022-05-14 ENCOUNTER — Other Ambulatory Visit (HOSPITAL_COMMUNITY): Payer: Self-pay

## 2022-05-14 DIAGNOSIS — I10 Essential (primary) hypertension: Secondary | ICD-10-CM | POA: Diagnosis not present

## 2022-05-14 DIAGNOSIS — E7800 Pure hypercholesterolemia, unspecified: Secondary | ICD-10-CM | POA: Diagnosis not present

## 2022-05-14 DIAGNOSIS — Z0000 Encounter for general adult medical examination without abnormal findings: Secondary | ICD-10-CM | POA: Diagnosis not present

## 2022-05-14 DIAGNOSIS — E1169 Type 2 diabetes mellitus with other specified complication: Secondary | ICD-10-CM | POA: Diagnosis not present

## 2022-05-20 ENCOUNTER — Other Ambulatory Visit: Payer: Self-pay | Admitting: Obstetrics & Gynecology

## 2022-05-20 ENCOUNTER — Ambulatory Visit
Admission: RE | Admit: 2022-05-20 | Discharge: 2022-05-20 | Disposition: A | Discharge status: Home / Self Care | Payer: 59 | Source: Ambulatory Visit | Attending: Obstetrics & Gynecology | Admitting: Obstetrics & Gynecology

## 2022-05-20 ENCOUNTER — Ambulatory Visit: Admission: RE | Admit: 2022-05-20 | Payer: 59 | Source: Ambulatory Visit

## 2022-05-20 DIAGNOSIS — R928 Other abnormal and inconclusive findings on diagnostic imaging of breast: Secondary | ICD-10-CM

## 2022-05-27 ENCOUNTER — Ambulatory Visit: Payer: 59 | Attending: Cardiology | Admitting: Cardiology

## 2022-05-27 ENCOUNTER — Other Ambulatory Visit (HOSPITAL_COMMUNITY): Payer: Self-pay

## 2022-05-27 ENCOUNTER — Ambulatory Visit (INDEPENDENT_AMBULATORY_CARE_PROVIDER_SITE_OTHER): Payer: 59

## 2022-05-27 ENCOUNTER — Encounter: Payer: Self-pay | Admitting: Cardiology

## 2022-05-27 VITALS — BP 110/72 | HR 72 | Ht 65.0 in | Wt 212.2 lb

## 2022-05-27 DIAGNOSIS — R072 Precordial pain: Secondary | ICD-10-CM

## 2022-05-27 DIAGNOSIS — Z01812 Encounter for preprocedural laboratory examination: Secondary | ICD-10-CM

## 2022-05-27 DIAGNOSIS — E0800 Diabetes mellitus due to underlying condition with hyperosmolarity without nonketotic hyperglycemic-hyperosmolar coma (NKHHC): Secondary | ICD-10-CM

## 2022-05-27 DIAGNOSIS — I10 Essential (primary) hypertension: Secondary | ICD-10-CM | POA: Diagnosis not present

## 2022-05-27 DIAGNOSIS — E782 Mixed hyperlipidemia: Secondary | ICD-10-CM

## 2022-05-27 DIAGNOSIS — E669 Obesity, unspecified: Secondary | ICD-10-CM

## 2022-05-27 DIAGNOSIS — R011 Cardiac murmur, unspecified: Secondary | ICD-10-CM

## 2022-05-27 DIAGNOSIS — R002 Palpitations: Secondary | ICD-10-CM | POA: Diagnosis not present

## 2022-05-27 MED ORDER — METOPROLOL TARTRATE 100 MG PO TABS
ORAL_TABLET | ORAL | 0 refills | Status: AC
  Filled 2022-05-27: qty 1, 1d supply, fill #0

## 2022-05-27 NOTE — Patient Instructions (Addendum)
Medication Instructions:  Your physician recommends that you continue on your current medications as directed. Please refer to the Current Medication list given to you today.  *If you need a refill on your cardiac medications before your next appointment, please call your pharmacy*   Lab Work: Your physician recommends that you have labs drawn today: BMET, Mag If you have labs (blood work) drawn today and your tests are completely normal, you will receive your results only by: MyChart Message (if you have MyChart) OR A paper copy in the mail If you have any lab test that is abnormal or we need to change your treatment, we will call you to review the results.   Testing/Procedures: Your physician has requested that you have an echocardiogram. Echocardiography is a painless test that uses sound waves to create images of your heart. It provides your doctor with information about the size and shape of your heart and how well your heart's chambers and valves are working. This procedure takes approximately one hour. There are no restrictions for this procedure. Please do NOT wear cologne, perfume, aftershave, or lotions (deodorant is allowed). Please arrive 15 minutes prior to your appointment time.    Your cardiac CT will be scheduled at one of the below locations:   [facility] [address] [place], [place] [address] [phone]   If scheduled at [facility], please arrive at the [facility] and Children's Entrance (Entrance C2) of [facility] 30 minutes prior to test start time. You can use the FREE valet parking offered at entrance C (encouraged to control the heart rate for the test)  Proceed to the [facility] Radiology Department (first floor) to check-in and test prep.  All radiology patients and guests should use entrance C2 at [facility], accessed from [facility], even though the hospital's physical address listed is [address]      Please follow these instructions carefully (unless otherwise directed):   On the Night Before the Test: Be sure to Drink plenty of water. Do not consume any caffeinated/decaffeinated beverages or chocolate 12 hours prior to your test. Do not take any antihistamines 12 hours prior to your test.  On the Day of the Test: Drink plenty of water until 1 hour prior to the test. Do not eat any food 1 hour prior to test. You may take your regular medications prior to the test.  Take metoprolol (Lopressor) two hours prior to test. If you take Hydrochlorothiazide/Metoprolol Succinate (Toprol- XL 100 mg), please HOLD on the morning of the test. FEMALES- please wear underwire-free bra if available, avoid dresses & tight clothing      After the Test: Drink plenty of water. After receiving IV contrast, you may experience a mild flushed feeling. This is normal. On occasion, you may experience a mild rash up to 24 hours after the test. This is not dangerous. If this occurs, you can take Benadryl 25 mg and increase your fluid intake. If you experience trouble breathing, this can be serious. If it is severe call 911 IMMEDIATELY. If it is mild, please call our office. If you take any of these medications: Glipizide/Metformin, Avandament, Glucavance, please do not take 48 hours after completing test unless otherwise instructed.  We will call to schedule your test 2-4 weeks out understanding that some insurance companies will need an authorization prior to the service being performed.   For non-scheduling related questions, please contact the cardiac imaging nurse navigator  should you have any questions/concerns: Marchia Bond, Cardiac Imaging Nurse Navigator Gordy Clement, Cardiac Imaging Nurse Navigator [facility] Direct Office Dial: [phone]   For scheduling needs, including cancellations and rescheduling, please call [place],  [phone].  ZIO XT- Long Term Monitor Instructions  Your physician has requested you wear a ZIO patch monitor for 14 days.  This is a single patch monitor. [employer] supplies one patch monitor per enrollment. Additional stickers are not available. Please do not apply patch if you will be having a Nuclear Stress Test,  Echocardiogram, Cardiac CT, MRI, or Chest Xray during the period you would be wearing the  monitor. The patch cannot be worn during these tests. You cannot remove and re-apply the  ZIO XT patch monitor.  Your ZIO patch monitor will be mailed 3 day USPS to your address on file. It may take 3-5 days  to receive your monitor after you have been enrolled.  Once you have received your monitor, please review the enclosed instructions. Your monitor  has already been registered assigning a specific monitor serial # to you.  Billing and Patient Assistance Program Information  We have supplied [employer] with any of your insurance information on file for billing purposes. [employer] offers a sliding scale Patient Assistance Program for patients that do not have  insurance, or whose insurance does not completely cover the cost of the ZIO monitor.  You must apply for the Patient Assistance Program to qualify for this discounted rate.  To apply, please call [employer] at [phone], select option 4, select option 2, ask to apply for  Patient Assistance Program. Theodore Demark will ask your household income, and how many people  are in your household. They will quote your out-of-pocket cost based on that information.  [employer] will also be able to set up a 69-month, interest-free payment plan if needed.  Applying the monitor   Shave hair from upper left chest.  Hold abrader disc by orange tab. Rub abrader in 40 strokes over the upper left chest as  indicated in your monitor instructions.  Clean area with 4 enclosed alcohol pads. Let dry.  Apply patch as indicated in monitor instructions. Patch will  be placed under collarbone on left  side of chest with arrow pointing upward.  Rub patch adhesive wings for 2 minutes. Remove white label marked "1". Remove the white  label marked "2". Rub patch adhesive wings for 2 additional minutes.  While looking in a mirror, press and release button in center of patch. A small green light will  flash 3-4 times. This will be your only indicator that the monitor has been turned on.  Do not shower for the first 24 hours. You may shower after the first 24 hours.  Press the button if you feel a symptom. You will hear a small click. Record Date, Time and  Symptom in the Patient Logbook.  When you are ready to remove the patch, follow instructions on the last 2 pages of Patient  Logbook. Stick patch monitor onto the last page of Patient Logbook.  Place Patient Logbook in the blue and white box. Use locking tab on box and tape box closed  securely. The blue and white box has prepaid postage on it. Please place it in the mailbox as  soon as possible. Your physician should have your test results approximately 7 days after the  monitor has been mailed back to [facility].  Call [place] at [phone] if you have  questions regarding  your ZIO XT patch monitor. Call them immediately if you see an orange light blinking on your  monitor.  If your monitor falls off in less than 4 days, contact our Monitor department at [phone].  If your monitor becomes loose or falls off after 4 days call [employer] at [phone] for  suggestions on securing your monitor    Follow-Up: At [facility], you and your health needs are our priority.  As part of our continuing mission to provide you with exceptional heart care, we have created designated Provider Care Teams.  These Care Teams include your primary Cardiologist (physician) and Advanced Practice Providers (APPs -  Physician Assistants and Nurse Practitioners) who all work together to  provide you with the care you need, when you need it.  Your next appointment:   3 month(s)  Provider:   Berniece Salines, DO

## 2022-05-27 NOTE — Progress Notes (Signed)
Cardiology Office Note:    Date:  05/30/2022   ID:  Mliss Sax Hernandez, DOB 11/18/1960, MRN [id number]  PCP:  Leeroy Cha, MD  Cardiologist:  Berniece Salines, DO  Electrophysiologist:  None   Referring MD: Leeroy Cha,*   " I have palpations"   History of Present Illness:    Miranda Hernandez is a 62 y.o. female with a hx of hypertension, hyperlipidemia, diabetes mellitus, obesity, family history of coronary artery disease here today to be evaluated for intermittent palpitations as well as some chest discomfort.  She describes the palpitations intermittent comes and goes.  This been going on for least about a few months now.  The other issue that she is experiencing is left-sided chest discomfort.  It is also midsternal at times comes and goes.  Nothing makes it better or worse.  She has not had any syncope episode.  No lightheadedness or dizziness.  Evaluated shortness of breath.  Past Medical History:  Diagnosis Date   Anemia    Diabetes mellitus without complication (HCC)    HTN (hypertension)    Hypercholesteremia    Obesity     Past Surgical History:  Procedure Laterality Date   ABDOMINAL HYSTERECTOMY     CESAREAN SECTION     GASTRIC BYPASS      Current Medications: Current Meds  Medication Sig   amLODipine (NORVASC) 10 MG tablet Take 1 tablet by mouth once a day   cyanocobalamin (,VITAMIN B-12,) 1000 MCG/ML injection Inject 1 ml intramuscularly once a month as directed   dapagliflozin propanediol (FARXIGA) 5 MG TABS tablet Take 1 tablet (5 mg total) by mouth daily.   docusate sodium (COLACE) 100 MG capsule Take 100 mg by mouth 2 (two) times daily.   Dulaglutide (TRULICITY) 1.5 0000000 SOPN Inject 1.5 mg into the skin once a week as directed   ferrous sulfate 325 (65 FE) MG tablet Take 325 mg by mouth daily with breakfast.   hydrochlorothiazide (HYDRODIURIL) 25 MG tablet Take 1 tablet by mouth daily.   ibuprofen (ADVIL,MOTRIN) 600 MG tablet Take 1  tablet (600 mg total) by mouth every 8 (eight) hours as needed.   metFORMIN (GLUCOPHAGE-XR) 500 MG 24 hr tablet Take 2 tablets (1,000 mg total) by mouth daily.   metoprolol succinate (TOPROL-XL) 100 MG 24 hr tablet TAKE 1 & 1/2 TABLETS BY MOUTH DAILY   metoprolol tartrate (LOPRESSOR) 100 MG tablet Take by mouth 2 hours prior to CT as directed.   mometasone (NASONEX) 50 MCG/ACT nasal spray Place 2 sprays into the nose daily.   rosuvastatin (CRESTOR) 5 MG tablet Take 1 tablet by mouth daily.   Vitamin D, Ergocalciferol, (DRISDOL) 1.25 MG (50000 UT) CAPS capsule TAKE 1 CAPSULE (50,000 UNITS TOTAL) BY MOUTH EVERY 7 (SEVEN) DAYS.     Allergies:   Patient has no known allergies.   Social History   Socioeconomic History   Marital status: Married    Spouse name: Not on file   Number of children: Not on file   Years of education: Not on file   Highest education level: Not on file  Occupational History   Not on file  Tobacco Use   Smoking status: Never   Smokeless tobacco: Never  Substance and Sexual Activity   Alcohol use: Yes    Comment: occ. wine   Drug use: No   Sexual activity: Yes    Birth control/protection: Surgical    Comment: Hysterectomy  Other Topics Concern   Not on file  Social History Narrative   Not on file   Social Determinants of Health   Financial Resource Strain: Not on file  Food Insecurity: Not on file  Transportation Needs: Not on file  Physical Activity: Not on file  Stress: Not on file  Social Connections: Not on file     Family History: The patient's family history includes Breast cancer (age of onset: 60) in her cousin; Diabetes in her mother; Heart disease in her father and mother; Hypertension in her father and mother; Stroke in her mother. There is no history of Arthritis or Colon cancer.  ROS:   Review of Systems  Constitution: Negative for decreased appetite, fever and weight gain.  HENT: Negative for congestion, ear discharge, hoarse voice  and sore throat.   Eyes: Negative for discharge, redness, vision loss in right eye and visual halos.  Cardiovascular: Reports palpitation and chest discomfort.  Negative for dyspnea on exertion, leg swelling, orthopnea Respiratory: Negative for cough, hemoptysis, shortness of breath and snoring.   Endocrine: Negative for heat intolerance and polyphagia.  Hematologic/Lymphatic: Negative for bleeding problem. Does not bruise/bleed easily.  Skin: Negative for flushing, nail changes, rash and suspicious lesions.  Musculoskeletal: Negative for arthritis, joint pain, muscle cramps, myalgias, neck pain and stiffness.  Gastrointestinal: Negative for abdominal pain, bowel incontinence, diarrhea and excessive appetite.  Genitourinary: Negative for decreased libido, genital sores and incomplete emptying.  Neurological: Negative for brief paralysis, focal weakness, headaches and loss of balance.  Psychiatric/Behavioral: Negative for altered mental status, depression and suicidal ideas.  Allergic/Immunologic: Negative for HIV exposure and persistent infections.    EKGs/Labs/Other Studies Reviewed:    The following studies were reviewed today:   EKG:  The ekg ordered today demonstrates sinus rhythm with PACs.  Recent Labs: 10/01/2021: Hemoglobin 11.6; Platelets 213.0 05/27/2022: BUN 16; Creatinine, Ser 0.97; Magnesium 2.1; Potassium 4.2; Sodium 144  Recent Lipid Panel No results found for: "CHOL", "TRIG", "HDL", "CHOLHDL", "VLDL", "LDLCALC", "LDLDIRECT"  Physical Exam:    VS:  BP 110/72   Pulse 72   Ht 5\' 5"  (1.651 m)   Wt 212 lb 3.2 oz (96.3 kg)   SpO2 100%   BMI 35.31 kg/m     Wt Readings from Last 3 Encounters:  05/27/22 212 lb 3.2 oz (96.3 kg)  11/04/21 213 lb (96.6 kg)  11/03/18 223 lb (101.2 kg)     GEN: Well nourished, well developed in no acute distress HEENT: Normal NECK: No JVD; No carotid bruits LYMPHATICS: No lymphadenopathy CARDIAC: S1S2 noted,RRR, no murmurs, rubs,  gallops RESPIRATORY:  Clear to auscultation without rales, wheezing or rhonchi  ABDOMEN: Soft, non-tender, non-distended, +bowel sounds, no guarding. EXTREMITIES: No edema, No cyanosis, no clubbing MUSCULOSKELETAL:  No deformity  SKIN: Warm and dry NEUROLOGIC:  Alert and oriented x 3, non-focal PSYCHIATRIC:  Normal affect, good insight  ASSESSMENT:    1. Precordial pain   2. Pre-procedure lab exam   3. Palpitations   4. Murmur, cardiac   5. Primary hypertension   6. Mixed hyperlipidemia   7. Obesity (BMI 30-39.9)   8. Diabetes mellitus due to underlying condition with hyperosmolarity without coma, without long-term current use of insulin (HCC)    PLAN:     The symptoms chest pain is concerning, this patient does have intermediate risk for coronary artery disease and at this time I would like to pursue an ischemic evaluation in this patient.  Shared decision a coronary CTA at this time is appropriate.  I have discussed with the  patient about the testing.  The patient has no IV contrast allergy and is agreeable to proceed with this test.  Physical exam show holosystolic murmur.  Will get an echocardiogram to assess for any structural abnormalities.  I would like to rule out a cardiovascular etiology of this palpitation, therefore at this time I would like to placed a zio patch for  14  days. In additon a transthoracic echocardiogram will be ordered to assess LV/RV function and any structural abnormalities.   Blood pressure is acceptable, continue with current antihypertensive regimen.  Hyperlipidemia - continue with current statin medication.  The patient understands the need to lose weight with diet and exercise. We have discussed specific strategies for this.   The patient is in agreement with the above plan. The patient left the office in stable condition.  The patient will follow up 3 months virtually to discussed testing results.   Medication Adjustments/Labs and Tests  Ordered: Current medicines are reviewed at length with the patient today.  Concerns regarding medicines are outlined above.  Orders Placed This Encounter  Procedures   CT CORONARY MORPH W/CTA COR W/SCORE W/CA W/CM &/OR WO/CM   Basic Metabolic Panel (BMET)   Magnesium   LONG TERM MONITOR (3-14 DAYS)   EKG 12-Lead   ECHOCARDIOGRAM COMPLETE   Meds ordered this encounter  Medications   metoprolol tartrate (LOPRESSOR) 100 MG tablet    Sig: Take by mouth 2 hours prior to CT as directed.    Dispense:  1 tablet    Refill:  0    Patient Instructions  Medication Instructions:  Your physician recommends that you continue on your current medications as directed. Please refer to the Current Medication list given to you today.  *If you need a refill on your cardiac medications before your next appointment, please call your pharmacy*   Lab Work: Your physician recommends that you have labs drawn today: BMET, Mag If you have labs (blood work) drawn today and your tests are completely normal, you will receive your results only by: MyChart Message (if you have MyChart) OR A paper copy in the mail If you have any lab test that is abnormal or we need to change your treatment, we will call you to review the results.   Testing/Procedures: Your physician has requested that you have an echocardiogram. Echocardiography is a painless test that uses sound waves to create images of your heart. It provides your doctor with information about the size and shape of your heart and how well your heart's chambers and valves are working. This procedure takes approximately one hour. There are no restrictions for this procedure. Please do NOT wear cologne, perfume, aftershave, or lotions (deodorant is allowed). Please arrive 15 minutes prior to your appointment time.    Your cardiac CT will be scheduled at one of the below locations:   [facility] [address] [place], [place] [address] [phone]   If scheduled at [facility], please arrive at the [facility] and Children's Entrance (Entrance C2) of [facility] 30 minutes prior to test start time. You can use the FREE valet parking offered at entrance C (encouraged to control the heart rate for the test)  Proceed to the [facility] Radiology Department (first floor) to check-in and test prep.  All radiology patients and guests should use entrance C2 at [facility], accessed from [facility], even though the hospital's physical address listed is [address].  Please follow these instructions carefully (unless otherwise directed):   On the Night Before the Test: Be sure to Drink plenty of water. Do not consume any caffeinated/decaffeinated beverages or chocolate 12 hours prior to your test. Do not take any antihistamines 12 hours prior to your test.  On the Day of the Test: Drink plenty of water until 1 hour prior to the test. Do not eat any food 1 hour prior to test. You may take your regular medications prior to the test.  Take metoprolol (Lopressor) two hours prior to test. If you take Hydrochlorothiazide/Metoprolol Succinate (Toprol- XL 100 mg), please HOLD on the morning of the test. FEMALES- please wear underwire-free bra if available, avoid dresses & tight clothing      After the Test: Drink plenty of water. After receiving IV contrast, you may experience a mild flushed feeling. This is normal. On occasion, you may experience a mild rash up to 24 hours after the test. This is not dangerous. If this occurs, you can take Benadryl 25 mg and increase your fluid intake. If you experience trouble breathing, this can be serious. If it is severe call 911 IMMEDIATELY. If it is mild, please call our office. If you take any of these medications: Glipizide/Metformin, Avandament, Glucavance, please do not take 48 hours after completing test unless otherwise instructed.  We  will call to schedule your test 2-4 weeks out understanding that some insurance companies will need an authorization prior to the service being performed.   For non-scheduling related questions, please contact the cardiac imaging nurse navigator should you have any questions/concerns: Marchia Bond, Cardiac Imaging Nurse Navigator Gordy Clement, Cardiac Imaging Nurse Navigator [facility] Direct Office Dial: [phone]   For scheduling needs, including cancellations and rescheduling, please call [place], [phone].  ZIO XT- Long Term Monitor Instructions  Your physician has requested you wear a ZIO patch monitor for 14 days.  This is a single patch monitor. [employer] supplies one patch monitor per enrollment. Additional stickers are not available. Please do not apply patch if you will be having a Nuclear Stress Test,  Echocardiogram, Cardiac CT, MRI, or Chest Xray during the period you would be wearing the  monitor. The patch cannot be worn during these tests. You cannot remove and re-apply the  ZIO XT patch monitor.  Your ZIO patch monitor will be mailed 3 day USPS to your address on file. It may take 3-5 days  to receive your monitor after you have been enrolled.  Once you have received your monitor, please review the enclosed instructions. Your monitor  has already been registered assigning a specific monitor serial # to you.  Billing and Patient Assistance Program Information  We have supplied [employer] with any of your insurance information on file for billing purposes. [employer] offers a sliding scale Patient Assistance Program for patients that do not have  insurance, or whose insurance does not completely cover the cost of the ZIO monitor.  You must apply for the Patient Assistance Program to qualify for this discounted rate.  To apply, please call [employer] at [phone], select option 4, select option 2, ask to apply for  Patient Assistance Program.  Theodore Demark will ask your household income, and how many people  are in your household. They will quote your out-of-pocket cost based on that information.  [employer] will also be able to set up a 15-month, interest-free payment plan if needed.  Applying the monitor   Shave hair from upper left  chest.  Hold abrader disc by orange tab. Rub abrader in 40 strokes over the upper left chest as  indicated in your monitor instructions.  Clean area with 4 enclosed alcohol pads. Let dry.  Apply patch as indicated in monitor instructions. Patch will be placed under collarbone on left  side of chest with arrow pointing upward.  Rub patch adhesive wings for 2 minutes. Remove white label marked "1". Remove the white  label marked "2". Rub patch adhesive wings for 2 additional minutes.  While looking in a mirror, press and release button in center of patch. A small green light will  flash 3-4 times. This will be your only indicator that the monitor has been turned on.  Do not shower for the first 24 hours. You may shower after the first 24 hours.  Press the button if you feel a symptom. You will hear a small click. Record Date, Time and  Symptom in the Patient Logbook.  When you are ready to remove the patch, follow instructions on the last 2 pages of Patient  Logbook. Stick patch monitor onto the last page of Patient Logbook.  Place Patient Logbook in the blue and white box. Use locking tab on box and tape box closed  securely. The blue and white box has prepaid postage on it. Please place it in the mailbox as  soon as possible. Your physician should have your test results approximately 7 days after the  monitor has been mailed back to [facility].  Call [place] at [phone] if you have questions regarding  your ZIO XT patch monitor. Call them immediately if you see an orange light blinking on your  monitor.  If your monitor falls off in less than 4 days, contact our Monitor  department at [phone].  If your monitor becomes loose or falls off after 4 days call [employer] at [phone] for  suggestions on securing your monitor    Follow-Up: At [facility], you and your health needs are our priority.  As part of our continuing mission to provide you with exceptional heart care, we have created designated Provider Care Teams.  These Care Teams include your primary Cardiologist (physician) and Advanced Practice Providers (APPs -  Physician Assistants and Nurse Practitioners) who all work together to provide you with the care you need, when you need it.  Your next appointment:   3 month(s)  Provider:   Berniece Salines, DO    Adopting a Healthy Lifestyle.  Know what a healthy weight is for you (roughly BMI <25) and aim to maintain this   Aim for 7+ servings of fruits and vegetables daily   65-80+ fluid ounces of water or unsweet tea for healthy kidneys   Limit to max 1 drink of alcohol per day; avoid smoking/tobacco   Limit animal fats in diet for cholesterol and heart health - choose grass fed whenever available   Avoid highly processed foods, and foods high in saturated/trans fats   Aim for low stress - take time to unwind and care for your mental health   Aim for 150 min of moderate intensity exercise weekly for heart health, and weights twice weekly for bone health   Aim for 7-9 hours of sleep daily   When it comes to diets, agreement about the perfect plan isnt easy to find, even among the experts. Experts at the [employer] developed an idea known as the Healthy Eating Plate. Just imagine a plate divided  into logical, healthy portions.   The emphasis is on diet quality:   Load up on vegetables and fruits - one-half of your plate: Aim for color and variety, and remember that potatoes dont count.   Go for whole grains - one-quarter of your plate: Whole wheat, barley, wheat berries, quinoa, oats, brown rice, and  foods made with them. If you want pasta, go with whole wheat pasta.   Protein power - one-quarter of your plate: Fish, chicken, beans, and nuts are all healthy, versatile protein sources. Limit red meat.   The diet, however, does go beyond the plate, offering a few other suggestions.   Use healthy plant oils, such as olive, canola, soy, corn, sunflower and peanut. Check the labels, and avoid partially hydrogenated oil, which have unhealthy trans fats.   If youre thirsty, drink water. Coffee and tea are good in moderation, but skip sugary drinks and limit milk and dairy products to one or two daily servings.   The type of carbohydrate in the diet is more important than the amount. Some sources of carbohydrates, such as vegetables, fruits, whole grains, and beans-are healthier than others.   Finally, stay active  Signed, Berniece Salines, DO  05/30/2022 11:39 AM    [place]

## 2022-05-27 NOTE — Progress Notes (Unsigned)
Enrolled patient for a 14 day Zio XT  monitor to be mailed to patients home  °

## 2022-05-28 LAB — BASIC METABOLIC PANEL
BUN/Creatinine Ratio: 16 (ref 12–28)
BUN: 16 mg/dL (ref 8–27)
CO2: 26 mmol/L (ref 20–29)
Calcium: 9.7 mg/dL (ref 8.7–10.3)
Chloride: 102 mmol/L (ref 96–106)
Creatinine, Ser: 0.97 mg/dL (ref 0.57–1.00)
Glucose: 121 mg/dL — ABNORMAL HIGH (ref 70–99)
Potassium: 4.2 mmol/L (ref 3.5–5.2)
Sodium: 144 mmol/L (ref 134–144)
eGFR: 66 mL/min/{1.73_m2} (ref >59)

## 2022-05-28 LAB — MAGNESIUM: Magnesium: 2.1 mg/dL (ref 1.6–2.3)

## 2022-05-30 DIAGNOSIS — R011 Cardiac murmur, unspecified: Secondary | ICD-10-CM | POA: Insufficient documentation

## 2022-05-30 DIAGNOSIS — Z01812 Encounter for preprocedural laboratory examination: Secondary | ICD-10-CM | POA: Insufficient documentation

## 2022-05-30 DIAGNOSIS — R072 Precordial pain: Secondary | ICD-10-CM | POA: Insufficient documentation

## 2022-05-30 DIAGNOSIS — R002 Palpitations: Secondary | ICD-10-CM | POA: Insufficient documentation

## 2022-05-31 ENCOUNTER — Telehealth (HOSPITAL_COMMUNITY): Payer: Self-pay | Admitting: *Deleted

## 2022-05-31 NOTE — Telephone Encounter (Signed)
Reaching out to patient to offer assistance regarding upcoming cardiac imaging study; pt verbalizes understanding of appt date/time, parking situation and where to check in, pre-test NPO status and medications ordered, and verified current allergies; name and call back number provided for further questions should they arise ? ?[name] RN Navigator Cardiac Imaging ?[facility] ?[phone] office ?[phone] cell ? ?Patient to take 100mg metoprolol tartrate two hours prior to her cardiac CT scan.  She is aware to arrive at 12:30pm. ?

## 2022-06-01 ENCOUNTER — Ambulatory Visit (HOSPITAL_COMMUNITY)
Admission: RE | Admit: 2022-06-01 | Discharge: 2022-06-01 | Disposition: A | Discharge status: Home / Self Care | Payer: 59 | Source: Ambulatory Visit | Attending: Cardiology | Admitting: Cardiology

## 2022-06-01 DIAGNOSIS — R072 Precordial pain: Secondary | ICD-10-CM

## 2022-06-01 MED ORDER — NITROGLYCERIN 0.4 MG SL SUBL
SUBLINGUAL_TABLET | SUBLINGUAL | Status: AC
  Filled 2022-06-01: qty 2

## 2022-06-01 MED ORDER — NITROGLYCERIN 0.4 MG SL SUBL
0.8000 mg | SUBLINGUAL_TABLET | Freq: Once | SUBLINGUAL | Status: AC
  Administered 2022-06-01: 0.8 mg via SUBLINGUAL

## 2022-06-01 MED ORDER — IOHEXOL 350 MG/ML SOLN
100.0000 mL | Freq: Once | INTRAVENOUS | Status: AC | PRN
  Administered 2022-06-01: 100 mL via INTRAVENOUS

## 2022-06-03 DIAGNOSIS — R002 Palpitations: Secondary | ICD-10-CM

## 2022-06-03 DIAGNOSIS — R072 Precordial pain: Secondary | ICD-10-CM

## 2022-06-07 ENCOUNTER — Other Ambulatory Visit: Payer: Self-pay

## 2022-06-07 DIAGNOSIS — R9389 Abnormal findings on diagnostic imaging of other specified body structures: Secondary | ICD-10-CM

## 2022-06-07 NOTE — Progress Notes (Signed)
Liver US order placed

## 2022-06-08 ENCOUNTER — Other Ambulatory Visit: Payer: 59

## 2022-06-10 ENCOUNTER — Other Ambulatory Visit (HOSPITAL_COMMUNITY): Payer: Self-pay

## 2022-06-18 ENCOUNTER — Other Ambulatory Visit (HOSPITAL_COMMUNITY): Payer: Self-pay

## 2022-06-21 ENCOUNTER — Other Ambulatory Visit (HOSPITAL_COMMUNITY): Payer: Self-pay

## 2022-06-21 MED ORDER — METFORMIN HCL ER 500 MG PO TB24
1000.0000 mg | ORAL_TABLET | Freq: Every day | ORAL | 3 refills | Status: DC
  Filled 2022-06-21: qty 180, 90d supply, fill #0
  Filled 2022-09-14: qty 180, 90d supply, fill #1
  Filled 2022-12-07: qty 180, 90d supply, fill #2
  Filled 2023-03-17: qty 180, 90d supply, fill #3

## 2022-06-22 ENCOUNTER — Other Ambulatory Visit (HOSPITAL_COMMUNITY): Payer: Self-pay

## 2022-06-22 DIAGNOSIS — R072 Precordial pain: Secondary | ICD-10-CM | POA: Diagnosis not present

## 2022-06-22 DIAGNOSIS — R002 Palpitations: Secondary | ICD-10-CM | POA: Diagnosis not present

## 2022-06-25 ENCOUNTER — Ambulatory Visit (HOSPITAL_COMMUNITY): Payer: 59 | Attending: Cardiology

## 2022-06-25 DIAGNOSIS — R072 Precordial pain: Secondary | ICD-10-CM | POA: Insufficient documentation

## 2022-06-25 LAB — ECHOCARDIOGRAM COMPLETE
Area-P 1/2: 3.42 cm2
S' Lateral: 2.9 cm

## 2022-07-01 ENCOUNTER — Other Ambulatory Visit: Payer: Self-pay

## 2022-07-11 ENCOUNTER — Other Ambulatory Visit (HOSPITAL_COMMUNITY): Payer: Self-pay

## 2022-07-15 ENCOUNTER — Other Ambulatory Visit (HOSPITAL_COMMUNITY): Payer: Self-pay

## 2022-07-16 ENCOUNTER — Other Ambulatory Visit (HOSPITAL_COMMUNITY): Payer: Self-pay

## 2022-07-16 MED ORDER — AMLODIPINE BESYLATE 10 MG PO TABS
10.0000 mg | ORAL_TABLET | Freq: Every day | ORAL | 3 refills | Status: DC
  Filled 2022-07-16: qty 90, 90d supply, fill #0
  Filled 2022-10-12: qty 90, 90d supply, fill #1
  Filled 2023-02-07: qty 90, 90d supply, fill #2
  Filled 2023-05-18: qty 90, 90d supply, fill #3

## 2022-08-06 ENCOUNTER — Other Ambulatory Visit (HOSPITAL_COMMUNITY): Payer: Self-pay

## 2022-08-09 ENCOUNTER — Other Ambulatory Visit (HOSPITAL_COMMUNITY): Payer: Self-pay

## 2022-08-09 MED ORDER — METOPROLOL SUCCINATE ER 100 MG PO TB24
150.0000 mg | ORAL_TABLET | Freq: Every day | ORAL | 3 refills | Status: AC
  Filled 2022-12-07: qty 145, 96d supply, fill #0
  Filled 2023-03-17: qty 135, 90d supply, fill #0
  Filled 2023-08-09: qty 135, 90d supply, fill #1

## 2022-08-09 MED ORDER — METOPROLOL SUCCINATE ER 100 MG PO TB24
150.0000 mg | ORAL_TABLET | Freq: Every day | ORAL | 3 refills | Status: AC
  Filled 2022-08-09: qty 135, 90d supply, fill #0
  Filled 2022-12-13: qty 135, 90d supply, fill #1

## 2022-08-10 ENCOUNTER — Other Ambulatory Visit (HOSPITAL_COMMUNITY): Payer: Self-pay

## 2022-08-12 ENCOUNTER — Other Ambulatory Visit (HOSPITAL_COMMUNITY): Payer: Self-pay

## 2022-08-12 MED ORDER — BENZONATATE 100 MG PO CAPS
100.0000 mg | ORAL_CAPSULE | Freq: Three times a day (TID) | ORAL | 0 refills | Status: AC
  Filled 2022-08-12: qty 21, 7d supply, fill #0

## 2022-08-12 MED ORDER — LAGEVRIO 200 MG PO CAPS
4.0000 | ORAL_CAPSULE | Freq: Two times a day (BID) | ORAL | 0 refills | Status: AC
  Filled 2022-08-12: qty 40, 5d supply, fill #0

## 2022-08-31 ENCOUNTER — Telehealth: Payer: 59 | Admitting: Cardiology

## 2022-09-14 ENCOUNTER — Other Ambulatory Visit (HOSPITAL_COMMUNITY): Payer: Self-pay

## 2022-09-16 ENCOUNTER — Other Ambulatory Visit: Payer: Self-pay

## 2022-09-17 ENCOUNTER — Other Ambulatory Visit (HOSPITAL_COMMUNITY): Payer: Self-pay

## 2022-09-17 MED ORDER — HYDROCHLOROTHIAZIDE 25 MG PO TABS
25.0000 mg | ORAL_TABLET | Freq: Every day | ORAL | 3 refills | Status: AC
  Filled 2022-09-17: qty 90, 90d supply, fill #0
  Filled 2022-12-13: qty 90, 90d supply, fill #1
  Filled 2023-03-17: qty 90, 90d supply, fill #2
  Filled 2023-06-22: qty 90, 90d supply, fill #3

## 2022-09-18 ENCOUNTER — Other Ambulatory Visit (HOSPITAL_COMMUNITY): Payer: Self-pay

## 2022-10-12 ENCOUNTER — Other Ambulatory Visit (HOSPITAL_COMMUNITY): Payer: Self-pay

## 2022-10-15 ENCOUNTER — Other Ambulatory Visit (HOSPITAL_COMMUNITY): Payer: Self-pay

## 2022-11-11 DIAGNOSIS — E1169 Type 2 diabetes mellitus with other specified complication: Secondary | ICD-10-CM | POA: Diagnosis not present

## 2022-11-11 DIAGNOSIS — I10 Essential (primary) hypertension: Secondary | ICD-10-CM | POA: Diagnosis not present

## 2022-11-16 ENCOUNTER — Other Ambulatory Visit (HOSPITAL_COMMUNITY): Payer: Self-pay

## 2022-11-16 ENCOUNTER — Other Ambulatory Visit: Payer: Self-pay

## 2022-12-07 ENCOUNTER — Other Ambulatory Visit (HOSPITAL_COMMUNITY): Payer: Self-pay

## 2022-12-13 ENCOUNTER — Other Ambulatory Visit (HOSPITAL_COMMUNITY): Payer: Self-pay

## 2023-01-07 ENCOUNTER — Other Ambulatory Visit (HOSPITAL_COMMUNITY): Payer: Self-pay

## 2023-01-11 ENCOUNTER — Other Ambulatory Visit (HOSPITAL_COMMUNITY): Payer: Self-pay

## 2023-01-20 ENCOUNTER — Other Ambulatory Visit (HOSPITAL_COMMUNITY): Payer: Self-pay

## 2023-01-25 ENCOUNTER — Other Ambulatory Visit (HOSPITAL_COMMUNITY): Payer: Self-pay

## 2023-01-31 ENCOUNTER — Other Ambulatory Visit (HOSPITAL_COMMUNITY): Payer: Self-pay

## 2023-01-31 MED ORDER — ROSUVASTATIN CALCIUM 5 MG PO TABS
5.0000 mg | ORAL_TABLET | Freq: Every day | ORAL | 4 refills | Status: AC
  Filled 2023-01-31: qty 90, 90d supply, fill #0

## 2023-02-01 ENCOUNTER — Other Ambulatory Visit (HOSPITAL_COMMUNITY): Payer: Self-pay

## 2023-02-02 ENCOUNTER — Other Ambulatory Visit (HOSPITAL_COMMUNITY): Payer: Self-pay

## 2023-02-07 ENCOUNTER — Other Ambulatory Visit (HOSPITAL_COMMUNITY): Payer: Self-pay

## 2023-03-17 ENCOUNTER — Other Ambulatory Visit (HOSPITAL_COMMUNITY): Payer: Self-pay

## 2023-03-17 ENCOUNTER — Other Ambulatory Visit: Payer: Self-pay

## 2023-03-18 ENCOUNTER — Other Ambulatory Visit (HOSPITAL_COMMUNITY): Payer: Self-pay

## 2023-04-05 ENCOUNTER — Other Ambulatory Visit (HOSPITAL_COMMUNITY): Payer: Self-pay

## 2023-04-19 ENCOUNTER — Other Ambulatory Visit (HOSPITAL_COMMUNITY): Payer: Self-pay

## 2023-05-18 ENCOUNTER — Other Ambulatory Visit (HOSPITAL_COMMUNITY): Payer: Self-pay

## 2023-05-23 ENCOUNTER — Other Ambulatory Visit (HOSPITAL_COMMUNITY): Payer: Self-pay

## 2023-05-25 ENCOUNTER — Other Ambulatory Visit (HOSPITAL_COMMUNITY): Payer: Self-pay

## 2023-05-25 ENCOUNTER — Other Ambulatory Visit: Payer: Self-pay

## 2023-05-25 DIAGNOSIS — E559 Vitamin D deficiency, unspecified: Secondary | ICD-10-CM | POA: Diagnosis not present

## 2023-05-25 DIAGNOSIS — E1169 Type 2 diabetes mellitus with other specified complication: Secondary | ICD-10-CM | POA: Diagnosis not present

## 2023-05-25 DIAGNOSIS — J301 Allergic rhinitis due to pollen: Secondary | ICD-10-CM | POA: Diagnosis not present

## 2023-05-25 DIAGNOSIS — H47012 Ischemic optic neuropathy, left eye: Secondary | ICD-10-CM | POA: Diagnosis not present

## 2023-05-25 DIAGNOSIS — Z6838 Body mass index (BMI) 38.0-38.9, adult: Secondary | ICD-10-CM | POA: Diagnosis not present

## 2023-05-25 DIAGNOSIS — Z0000 Encounter for general adult medical examination without abnormal findings: Secondary | ICD-10-CM | POA: Diagnosis not present

## 2023-05-25 DIAGNOSIS — I83893 Varicose veins of bilateral lower extremities with other complications: Secondary | ICD-10-CM | POA: Diagnosis not present

## 2023-05-25 DIAGNOSIS — E7800 Pure hypercholesterolemia, unspecified: Secondary | ICD-10-CM | POA: Diagnosis not present

## 2023-05-25 DIAGNOSIS — I10 Essential (primary) hypertension: Secondary | ICD-10-CM | POA: Diagnosis not present

## 2023-05-25 MED ORDER — AMLODIPINE BESYLATE 10 MG PO TABS
10.0000 mg | ORAL_TABLET | Freq: Every day | ORAL | 5 refills | Status: AC
  Filled 2023-05-30 – 2023-09-05 (×2): qty 90, 90d supply, fill #0

## 2023-05-25 MED ORDER — HYDROCHLOROTHIAZIDE 25 MG PO TABS: 25.0000 mg | ORAL_TABLET | Freq: Every morning | ORAL | 5 refills | Status: AC

## 2023-05-25 MED ORDER — METFORMIN HCL ER 500 MG PO TB24
1000.0000 mg | ORAL_TABLET | Freq: Every day | ORAL | 3 refills | Status: AC
  Filled 2023-06-22: qty 180, 90d supply, fill #0

## 2023-05-25 MED ORDER — BENAZEPRIL HCL 40 MG PO TABS
40.0000 mg | ORAL_TABLET | Freq: Every day | ORAL | 5 refills | Status: AC
  Filled 2023-05-25: qty 90, 90d supply, fill #0
  Filled 2023-09-05: qty 90, 90d supply, fill #1

## 2023-05-25 MED ORDER — METOPROLOL SUCCINATE ER 100 MG PO TB24: 150.0000 mg | ORAL_TABLET | Freq: Every day | ORAL | 5 refills | Status: AC

## 2023-05-25 MED ORDER — ROSUVASTATIN CALCIUM 5 MG PO TABS
5.0000 mg | ORAL_TABLET | Freq: Every day | ORAL | 4 refills | Status: AC
  Filled 2023-05-25: qty 90, 90d supply, fill #0
  Filled 2024-03-12: qty 90, 90d supply, fill #1

## 2023-05-25 MED ORDER — TRULICITY 3 MG/0.5ML ~~LOC~~ SOAJ
3.0000 mg | SUBCUTANEOUS | 1 refills | Status: DC
  Filled 2023-05-25: qty 2, 28d supply, fill #0
  Filled 2023-06-22: qty 2, 28d supply, fill #1
  Filled 2023-07-26: qty 2, 28d supply, fill #2
  Filled 2023-08-19: qty 6, 84d supply, fill #3

## 2023-05-26 ENCOUNTER — Other Ambulatory Visit (HOSPITAL_COMMUNITY): Payer: Self-pay

## 2023-05-27 DIAGNOSIS — E1169 Type 2 diabetes mellitus with other specified complication: Secondary | ICD-10-CM | POA: Diagnosis not present

## 2023-05-30 ENCOUNTER — Other Ambulatory Visit (HOSPITAL_COMMUNITY): Payer: Self-pay

## 2023-05-30 MED ORDER — DAPAGLIFLOZIN PROPANEDIOL 5 MG PO TABS
5.0000 mg | ORAL_TABLET | Freq: Every day | ORAL | 11 refills | Status: AC
  Filled 2023-05-30: qty 30, 30d supply, fill #0
  Filled 2023-08-12: qty 90, 90d supply, fill #1
  Filled 2023-12-25: qty 90, 90d supply, fill #2

## 2023-06-01 ENCOUNTER — Other Ambulatory Visit (HOSPITAL_COMMUNITY): Payer: Self-pay

## 2023-06-10 ENCOUNTER — Other Ambulatory Visit (HOSPITAL_COMMUNITY): Payer: Self-pay

## 2023-06-22 ENCOUNTER — Other Ambulatory Visit (HOSPITAL_COMMUNITY): Payer: Self-pay

## 2023-06-22 ENCOUNTER — Other Ambulatory Visit: Payer: Self-pay

## 2023-07-26 ENCOUNTER — Other Ambulatory Visit (HOSPITAL_COMMUNITY): Payer: Self-pay

## 2023-08-02 ENCOUNTER — Other Ambulatory Visit: Payer: Self-pay

## 2023-08-09 ENCOUNTER — Other Ambulatory Visit (HOSPITAL_COMMUNITY): Payer: Self-pay

## 2023-08-12 ENCOUNTER — Other Ambulatory Visit (HOSPITAL_COMMUNITY): Payer: Self-pay

## 2023-08-19 ENCOUNTER — Other Ambulatory Visit (HOSPITAL_COMMUNITY): Payer: Self-pay

## 2023-08-25 ENCOUNTER — Other Ambulatory Visit (HOSPITAL_COMMUNITY): Payer: Self-pay

## 2023-09-05 ENCOUNTER — Other Ambulatory Visit (HOSPITAL_COMMUNITY): Payer: Self-pay

## 2023-09-05 ENCOUNTER — Other Ambulatory Visit: Payer: Self-pay

## 2023-09-19 DIAGNOSIS — I10 Essential (primary) hypertension: Secondary | ICD-10-CM | POA: Diagnosis not present

## 2023-09-19 DIAGNOSIS — E1169 Type 2 diabetes mellitus with other specified complication: Secondary | ICD-10-CM | POA: Diagnosis not present

## 2023-09-21 ENCOUNTER — Other Ambulatory Visit (HOSPITAL_COMMUNITY): Payer: Self-pay

## 2023-09-21 ENCOUNTER — Other Ambulatory Visit (HOSPITAL_BASED_OUTPATIENT_CLINIC_OR_DEPARTMENT_OTHER): Payer: Self-pay

## 2023-09-21 DIAGNOSIS — I10 Essential (primary) hypertension: Secondary | ICD-10-CM | POA: Diagnosis not present

## 2023-09-21 DIAGNOSIS — E1169 Type 2 diabetes mellitus with other specified complication: Secondary | ICD-10-CM | POA: Diagnosis not present

## 2023-09-21 MED ORDER — ROSUVASTATIN CALCIUM 5 MG PO TABS
5.0000 mg | ORAL_TABLET | Freq: Every day | ORAL | 5 refills | Status: AC
  Filled 2023-09-21: qty 90, 90d supply, fill #0

## 2023-09-21 MED ORDER — BENAZEPRIL HCL 40 MG PO TABS
40.0000 mg | ORAL_TABLET | Freq: Every day | ORAL | 5 refills | Status: AC
  Filled 2023-12-16: qty 90, 90d supply, fill #0
  Filled 2024-03-12: qty 90, 90d supply, fill #1

## 2023-09-21 MED ORDER — METOPROLOL SUCCINATE ER 100 MG PO TB24
150.0000 mg | ORAL_TABLET | Freq: Every day | ORAL | 5 refills | Status: AC
  Filled 2023-09-21 – 2023-12-16 (×2): qty 135, 90d supply, fill #0

## 2023-09-21 MED ORDER — METFORMIN HCL ER 500 MG PO TB24
1000.0000 mg | ORAL_TABLET | Freq: Every day | ORAL | 3 refills | Status: AC
  Filled 2023-09-30: qty 180, 90d supply, fill #0
  Filled 2023-12-16: qty 180, 90d supply, fill #1

## 2023-09-21 MED ORDER — DAPAGLIFLOZIN PROPANEDIOL 5 MG PO TABS: 5.0000 mg | ORAL_TABLET | Freq: Every day | ORAL | 5 refills | Status: AC

## 2023-09-21 MED ORDER — AMLODIPINE BESYLATE 10 MG PO TABS
10.0000 mg | ORAL_TABLET | Freq: Every day | ORAL | 5 refills | Status: AC
  Filled 2023-12-25: qty 90, 90d supply, fill #0

## 2023-09-21 MED ORDER — HYDROCHLOROTHIAZIDE 25 MG PO TABS
25.0000 mg | ORAL_TABLET | Freq: Every morning | ORAL | 5 refills | Status: AC
  Filled 2023-09-21: qty 90, 90d supply, fill #0
  Filled 2024-01-17: qty 90, 90d supply, fill #1

## 2023-09-23 ENCOUNTER — Encounter: Payer: Self-pay | Admitting: Advanced Practice Midwife

## 2023-09-30 ENCOUNTER — Other Ambulatory Visit (HOSPITAL_COMMUNITY): Payer: Self-pay

## 2023-10-12 DIAGNOSIS — Z1231 Encounter for screening mammogram for malignant neoplasm of breast: Secondary | ICD-10-CM | POA: Diagnosis not present

## 2023-10-12 DIAGNOSIS — Z01411 Encounter for gynecological examination (general) (routine) with abnormal findings: Secondary | ICD-10-CM | POA: Diagnosis not present

## 2023-10-12 DIAGNOSIS — N952 Postmenopausal atrophic vaginitis: Secondary | ICD-10-CM | POA: Diagnosis not present

## 2023-10-12 DIAGNOSIS — N898 Other specified noninflammatory disorders of vagina: Secondary | ICD-10-CM | POA: Diagnosis not present

## 2023-10-14 ENCOUNTER — Other Ambulatory Visit (HOSPITAL_COMMUNITY): Payer: Self-pay

## 2023-10-17 ENCOUNTER — Other Ambulatory Visit (HOSPITAL_COMMUNITY): Payer: Self-pay

## 2023-10-17 MED ORDER — ESTRADIOL 0.1 MG/GM VA CREA
1.0000 | TOPICAL_CREAM | VAGINAL | 3 refills | Status: AC
  Filled 2023-10-17: qty 42.5, 84d supply, fill #0

## 2023-10-20 ENCOUNTER — Other Ambulatory Visit (HOSPITAL_COMMUNITY): Payer: Self-pay

## 2023-11-04 ENCOUNTER — Other Ambulatory Visit (HOSPITAL_COMMUNITY): Payer: Self-pay

## 2023-11-04 MED ORDER — FLUCONAZOLE 150 MG PO TABS
150.0000 mg | ORAL_TABLET | ORAL | 0 refills | Status: AC
  Filled 2023-11-04: qty 2, 6d supply, fill #0

## 2023-11-24 DIAGNOSIS — Z135 Encounter for screening for eye and ear disorders: Secondary | ICD-10-CM | POA: Diagnosis not present

## 2023-11-24 DIAGNOSIS — H524 Presbyopia: Secondary | ICD-10-CM | POA: Diagnosis not present

## 2023-11-24 DIAGNOSIS — E119 Type 2 diabetes mellitus without complications: Secondary | ICD-10-CM | POA: Diagnosis not present

## 2023-12-16 ENCOUNTER — Other Ambulatory Visit: Payer: Self-pay

## 2023-12-16 ENCOUNTER — Other Ambulatory Visit (HOSPITAL_COMMUNITY): Payer: Self-pay

## 2023-12-16 MED ORDER — TRULICITY 3 MG/0.5ML ~~LOC~~ SOAJ
SUBCUTANEOUS | 1 refills | Status: AC
  Filled 2023-12-16: qty 6, 84d supply, fill #0
  Filled 2024-03-09: qty 6, 84d supply, fill #1

## 2023-12-26 ENCOUNTER — Other Ambulatory Visit (HOSPITAL_COMMUNITY): Payer: Self-pay

## 2023-12-26 ENCOUNTER — Other Ambulatory Visit: Payer: Self-pay

## 2024-01-25 DIAGNOSIS — N289 Disorder of kidney and ureter, unspecified: Secondary | ICD-10-CM | POA: Diagnosis not present

## 2024-01-25 DIAGNOSIS — E1169 Type 2 diabetes mellitus with other specified complication: Secondary | ICD-10-CM | POA: Diagnosis not present

## 2024-02-01 ENCOUNTER — Other Ambulatory Visit (HOSPITAL_COMMUNITY): Payer: Self-pay

## 2024-02-01 DIAGNOSIS — N1831 Chronic kidney disease, stage 3a: Secondary | ICD-10-CM | POA: Diagnosis not present

## 2024-02-01 DIAGNOSIS — R040 Epistaxis: Secondary | ICD-10-CM | POA: Diagnosis not present

## 2024-02-01 DIAGNOSIS — E1165 Type 2 diabetes mellitus with hyperglycemia: Secondary | ICD-10-CM | POA: Diagnosis not present

## 2024-02-01 DIAGNOSIS — H9190 Unspecified hearing loss, unspecified ear: Secondary | ICD-10-CM | POA: Diagnosis not present

## 2024-02-01 MED ORDER — METFORMIN HCL ER 500 MG PO TB24
1000.0000 mg | ORAL_TABLET | Freq: Two times a day (BID) | ORAL | 5 refills | Status: AC
  Filled 2024-02-01 – 2024-02-13 (×2): qty 360, 90d supply, fill #0
  Filled ????-??-??: fill #0

## 2024-02-09 ENCOUNTER — Encounter (INDEPENDENT_AMBULATORY_CARE_PROVIDER_SITE_OTHER): Payer: Self-pay

## 2024-02-13 ENCOUNTER — Other Ambulatory Visit (HOSPITAL_COMMUNITY): Payer: Self-pay

## 2024-02-22 ENCOUNTER — Ambulatory Visit (INDEPENDENT_AMBULATORY_CARE_PROVIDER_SITE_OTHER): Admitting: Physician Assistant

## 2024-02-22 ENCOUNTER — Encounter (INDEPENDENT_AMBULATORY_CARE_PROVIDER_SITE_OTHER): Payer: Self-pay | Admitting: Physician Assistant

## 2024-02-22 VITALS — BP 96/64 | HR 68 | Temp 97.9°F | Ht 65.5 in | Wt 210.0 lb

## 2024-02-22 DIAGNOSIS — H9312 Tinnitus, left ear: Secondary | ICD-10-CM

## 2024-02-22 DIAGNOSIS — R040 Epistaxis: Secondary | ICD-10-CM | POA: Diagnosis not present

## 2024-02-23 NOTE — Progress Notes (Signed)
 Dear Dr. Elliot, Here is my assessment for our mutual patient, Miranda Hernandez. Thank you for allowing me the opportunity to care for your patient. Please do not hesitate to contact me should you have any other questions. Sincerely, Chyrl Cohen PA-C  Otolaryngology Clinic Note Referring provider: Dr. Elliot HPI:  Miranda Hernandez is a 63 y.o. female kindly referred by Dr. Elliot   Discussed the use of AI scribe software for clinical note transcription with the patient, who gave verbal consent to proceed.  History of Present Illness   Miranda Hernandez is a 63 year old female who presents with recurrent left-sided epistaxis and tinnitus in the left ear.  She experiences recurrent left-sided epistaxis, occurring with increased frequency recently, up to three or four times a week. These episodes can occur spontaneously, such as during church, and are exacerbated by actions like blowing her nose or sneezing. She uses Flonase for allergies as needed rather than daily. She recalls visiting an ENT in the past but does not remember undergoing cauterization. She associates the epistaxis with dry weather and allergy season. No pain in the nose, trouble smelling, nasal obstruction, or trauma to the nose. She is not on any antiplatelets or anticoagulants and does not take aspirin.  She experiences tinnitus in her left ear for less than a month. The ringing is persistent, and she attempts to block it out. No hearing loss, pain, or history of ear infections. She experiences some dizziness, described as feeling 'out of sync' rather than vertiginous, and associates it with low blood pressure. No trauma to the ear or significant childhood ear infections.  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 She has two adult children and grandchildren.         Independent Review of Additional Tests or Records:   None   PMH/Meds/All/SocHx/FamHx/ROS:   Past Medical History:  Diagnosis Date   Anemia    Diabetes mellitus without complication (HCC)    HTN (hypertension)    Hypercholesteremia    Obesity      Past Surgical History:  Procedure Laterality Date   ABDOMINAL HYSTERECTOMY     CESAREAN SECTION     GASTRIC BYPASS      Family History  Problem Relation Age of Onset   Diabetes Mother    Heart disease Mother    Stroke Mother    Hypertension Mother    Heart disease Father    Hypertension Father    Breast cancer Cousin 4       paternal cousin   Arthritis Neg Hx    Colon cancer Neg Hx      Social Connections: Not on file     Current Medications[1]   Physical Exam:   BP 96/64   Pulse 68   Temp 97.9 F (36.6 C)   Ht 5' 5.5 (1.664 m)   Wt 210 lb (95.3 kg)   SpO2 98%   BMI 34.41 kg/m   Pertinent Findings  CN II-XII grossly intact Bilateral EAC clear and TM intact with well pneumatized middle ear spaces Weber 512: equal Rinne 512: AC > BC b/l  Anterior rhinoscopy: Septum midline; bilateral inferior turbinates with minimal hypertrophy, superficial blood vessels noted along the left anterior septum with area of irritation. No lesions of oral cavity/oropharynx; dentition within normal limits No obviously palpable neck masses/lymphadenopathy/thyromegaly No respiratory distress or stridor       Seprately Identifiable Procedures:  PROCEDURE NOTE: Preoperative diagnosis: Left  epistaxis Postoperative diagnosis: same  Procedure: control of nasal hemorrhage anterior, simple (CPT [phone]) Indication: Epistaxis EBL: 0 mL Provider: Reyes Cohen PA-C   Procedure: The patient was identified and properly positioned and verbal consent obtained. Topical lidocaine and Afrin were applied to the nasal cavity  bilaterally and allowed to work for 10 minutes. The area over the left anterior septum which was the suspected source of bleeding was focally cauterized using silver nitrate  cautery with help of nasal speculum and headlight. There was no significant bleeding afterwards. Mupirocin ointment was applied over the area. Patient tolerated the procedure well   Impression & Plans:  Miranda Hernandez is a 63 y.o. female with the following   Assessment and Plan    Left anterior epistaxis Recurrent epistaxis from left anterior septum due to superficial vessels, Flonase use, and dry air.  - Cauterized left anterior septum with chemical cautery. - Apply Vaseline to nasal base 2-3 times daily. - Use saline nasal spray or irrigation. - Avoid nasal trauma and pressure on cauterized area. - Return if epistaxis persists after two weeks.  Left-sided tinnitus Tinnitus for less than a month without hearing loss or trauma. Differential includes idiopathic causes. - Proceed with audiological evaluation on January 12th. - Use noise-canceling devices or white noise machines if tinnitus persists. - Monitor for changes or associated symptoms.           - f/u 1 month   Thank you for allowing me the opportunity to care for your patient. Please do not hesitate to contact me should you have any other questions.  Sincerely, Chyrl Cohen PA-C [facility] Phone: [phone] Fax: [phone]  02/23/2024, 12:57 PM        [1]  Current Outpatient Medications:    amLODipine  (NORVASC ) 10 MG tablet, Take 1 tablet by mouth once a day, Disp: 90 tablet, Rfl: 3   benazepril  (LOTENSIN ) 40 MG tablet, Take 1 tablet (40 mg total) by mouth daily., Disp: 90 tablet, Rfl: 5   dapagliflozin  propanediol (FARXIGA ) 5 MG TABS tablet, Take 1 tablet (5 mg total) by mouth daily., Disp: 90 tablet, Rfl: 3   Dulaglutide  (TRULICITY ) 3 MG/0.5ML SOAJ, Inject 3 mg into the skin once a week., Disp: 6 mL, Rfl: 1   ferrous sulfate 325 (65 FE) MG tablet, Take 325 mg by mouth daily with breakfast. (Patient taking differently: Take 325 mg by mouth daily with breakfast. As needed), Disp: , Rfl:     hydrochlorothiazide  (HYDRODIURIL ) 25 MG tablet, Take 1 tablet by mouth daily., Disp: 90 tablet, Rfl: 3   metFORMIN  (GLUCOPHAGE -XR) 500 MG 24 hr tablet, Take 2 tablets (1,000 mg total) by mouth 2 (two) times daily., Disp: 360 tablet, Rfl: 5   metoprolol  succinate (TOPROL -XL) 100 MG 24 hr tablet, TAKE 1 & 1/2 TABLETS BY MOUTH DAILY, Disp: 135 tablet, Rfl: 1   mometasone  (NASONEX ) 50 MCG/ACT nasal spray, Place 2 sprays into the nose as needed., Disp: , Rfl:    rosuvastatin  (CRESTOR ) 5 MG tablet, Take 1 tablet by mouth daily., Disp: 90 tablet, Rfl: 3   amLODipine  (NORVASC ) 10 MG tablet, Take 1 tablet (10 mg total) by mouth daily. (Patient not taking: Reported on 02/22/2024), Disp: 90 tablet, Rfl: 5   amLODipine  (NORVASC ) 10 MG tablet, Take 1 tablet (10 mg total) by mouth daily. (Patient not taking: Reported on 02/22/2024), Disp: 90 tablet, Rfl: 5   ascorbic acid (VITAMIN C) 500 MG tablet, Take 500 mg by mouth as needed., Disp: , Rfl:  benazepril  (LOTENSIN ) 40 MG tablet, TAKE 1 TABLET BY MOUTH ONCE DAILY (Patient not taking: Reported on 02/22/2024), Disp: 90 tablet, Rfl: 3   benazepril  (LOTENSIN ) 40 MG tablet, Take 1 tablet (40 mg total) by mouth daily. (Patient not taking: Reported on 02/22/2024), Disp: 90 tablet, Rfl: 5   cyanocobalamin  (,VITAMIN B-12,) 1000 MCG/ML injection, Inject 1 ml intramuscularly once a month as directed (Patient not taking: Reported on 02/22/2024), Disp: 3 mL, Rfl: 1   dapagliflozin  propanediol (FARXIGA ) 5 MG TABS tablet, Take 1 tablet (5 mg total) by mouth daily. (Patient not taking: Reported on 02/22/2024), Disp: 90 tablet, Rfl: 3   dapagliflozin  propanediol (FARXIGA ) 5 MG TABS tablet, Take 1 tablet (5 mg total) by mouth daily. (Patient not taking: Reported on 02/22/2024), Disp: 30 tablet, Rfl: 11   dapagliflozin  propanediol (FARXIGA ) 5 MG TABS tablet, Take 1 tablet (5 mg total) by mouth daily. (Patient not taking: Reported on 02/22/2024), Disp: 90 tablet, Rfl: 5   docusate  sodium (COLACE) 100 MG capsule, Take 100 mg by mouth 2 (two) times daily. (Patient not taking: Reported on 02/22/2024), Disp: , Rfl:    Dulaglutide  (TRULICITY ) 1.5 MG/0.5ML SOAJ, Inject 1.5 mg into the skin once a week as directed (Patient not taking: Reported on 02/22/2024), Disp: 6 mL, Rfl: 4   estradiol  (ESTRACE ) 0.1 MG/GM vaginal cream, Insert into vagina 1 gram every night at bedtime for 2 weeks, then insert 1 gram into vagina twice a week at bedtime (e.g every monday and thursday). (Patient not taking: Reported on 02/22/2024), Disp: 42.5 g, Rfl: 3   fluconazole  (DIFLUCAN ) 150 MG tablet, Take 1 tablet (150 mg total) by mouth every 3 (three) days, for 2 doses for vaginal yeast infection (Patient not taking: Reported on 02/22/2024), Disp: 2 tablet, Rfl: 0   hydrochlorothiazide  (HYDRODIURIL ) 25 MG tablet, Take 1 tablet (25 mg total) by mouth daily. (Patient not taking: Reported on 02/22/2024), Disp: 90 tablet, Rfl: 3   hydrochlorothiazide  (HYDRODIURIL ) 25 MG tablet, Take 1 tablet (25 mg total) by mouth in the morning. (Patient not taking: Reported on 02/22/2024), Disp: 90 tablet, Rfl: 5   hydrochlorothiazide  (HYDRODIURIL ) 25 MG tablet, Take 1 tablet (25 mg total) by mouth in the morning. (Patient not taking: Reported on 02/22/2024), Disp: 90 tablet, Rfl: 5   ibuprofen  (ADVIL ,MOTRIN ) 600 MG tablet, Take 1 tablet (600 mg total) by mouth every 8 (eight) hours as needed. (Patient not taking: Reported on 02/22/2024), Disp: 30 tablet, Rfl: 0   loratadine (CLARITIN) 10 MG tablet, Take 10 mg by mouth daily as needed., Disp: , Rfl:    metFORMIN  (GLUCOPHAGE -XR) 500 MG 24 hr tablet, Take 2 tablets (1,000 mg total) by mouth daily. (Patient not taking: Reported on 02/22/2024), Disp: 180 tablet, Rfl: 3   metFORMIN  (GLUCOPHAGE -XR) 500 MG 24 hr tablet, Take 2 tablets (1,000 mg total) by mouth daily. (Patient not taking: Reported on 02/22/2024), Disp: 180 tablet, Rfl: 3   metoprolol  succinate (TOPROL -XL) 100 MG 24 hr  tablet, Take 1.5 tablets (150 mg total) by mouth daily. (Patient not taking: Reported on 02/22/2024), Disp: 135 tablet, Rfl: 3   metoprolol  succinate (TOPROL -XL) 100 MG 24 hr tablet, Take 1.5 tablets (150 mg total) by mouth daily. (Patient not taking: Reported on 02/22/2024), Disp: 145 tablet, Rfl: 3   metoprolol  succinate (TOPROL -XL) 100 MG 24 hr tablet, Take 1.5 tablets (150 mg total) by mouth daily. (Patient not taking: Reported on 02/22/2024), Disp: 135 tablet, Rfl: 5   metoprolol  succinate (TOPROL -XL) 100 MG 24 hr  tablet, Take 1.5 tablets (150 mg total) by mouth daily. (Patient not taking: Reported on 02/22/2024), Disp: 135 tablet, Rfl: 5   metoprolol  tartrate (LOPRESSOR ) 100 MG tablet, Take by mouth 2 hours prior to CT as directed., Disp: 1 tablet, Rfl: 0   mometasone  (NASONEX ) 50 MCG/ACT nasal spray, PLACE 2 SPRAYS IN EACH NOSTRIL ONCE A DAY (Patient not taking: Reported on 02/22/2024), Disp: 17 g, Rfl: 4   rosuvastatin  (CRESTOR ) 5 MG tablet, Take 1 tablet (5 mg total) by mouth daily. (Patient not taking: Reported on 02/22/2024), Disp: 90 tablet, Rfl: 4   rosuvastatin  (CRESTOR ) 5 MG tablet, Take 1 tablet (5 mg total) by mouth daily. (Patient not taking: Reported on 02/22/2024), Disp: 90 tablet, Rfl: 4   rosuvastatin  (CRESTOR ) 5 MG tablet, Take 1 tablet (5 mg total) by mouth daily. (Patient not taking: Reported on 02/22/2024), Disp: 90 tablet, Rfl: 5   testosterone (ANDROGEL) 50 MG/5GM (1%) GEL, APPLY 1/2 GRAM TO ARM,LEGS AND ABDOMEN ONCE A DAY (Patient not taking: Reported on 02/22/2024), Disp: 150 g, Rfl: 2   Vitamin D , Ergocalciferol , (DRISDOL ) 1.25 MG (50000 UT) CAPS capsule, TAKE 1 CAPSULE (50,000 UNITS TOTAL) BY MOUTH EVERY 7 (SEVEN) DAYS. (Patient not taking: Reported on 02/22/2024), Disp: 12 capsule, Rfl: 0

## 2024-02-27 ENCOUNTER — Other Ambulatory Visit (HOSPITAL_COMMUNITY): Payer: Self-pay

## 2024-02-27 ENCOUNTER — Other Ambulatory Visit: Payer: Self-pay

## 2024-03-09 ENCOUNTER — Other Ambulatory Visit: Payer: Self-pay

## 2024-03-12 ENCOUNTER — Other Ambulatory Visit: Payer: Self-pay

## 2024-03-19 ENCOUNTER — Ambulatory Visit: Attending: Internal Medicine | Admitting: Audiologist

## 2024-03-19 ENCOUNTER — Encounter: Payer: Self-pay | Admitting: Audiologist

## 2024-03-19 DIAGNOSIS — H903 Sensorineural hearing loss, bilateral: Secondary | ICD-10-CM | POA: Insufficient documentation

## 2024-03-19 DIAGNOSIS — H9312 Tinnitus, left ear: Secondary | ICD-10-CM | POA: Insufficient documentation

## 2024-03-19 NOTE — Procedures (Signed)
" °  Outpatient Audiology and [facility] [address] [place], [place]  [address] [phone]  AUDIOLOGICAL  EVALUATION  NAME: Miranda Hernandez     DOB:   [birth year]      MRN: [id number]                                                                                     DATE: 03/19/2024     REFERENT: Elliot Charm, MD STATUS: Outpatient DIAGNOSIS: Asymmetric Sensorineural Hearing Loss, Unilateral Tinnitus Left Ear    History: [name] was seen for an audiological evaluation due to tinnitus in the left ear. She has been seen by Otolaryngology at [facility]. They recommended audiology testing. The tinnitus is a high pitched sound. It started several weeks ago. The tinnitus is not interfering with sleep. [name] feels she hears well. She has had several left sided symptoms. She has issues with her left eye. She is also experiencing some dizziness. [name] denies pain or pressure today. [name] has no significant history of hazardous noise exposure.  Medical history shows no additional risk for hearing loss.    Evaluation:  Otoscopy showed a clear view of the tympanic membranes, bilaterally Tympanometry results were consistent with normal but shallow middle ear function, bilaterally   Audiometric testing was completed using Conventional Audiometry techniques with insert earphones and supraural headphones. Test results are consistent with normal hearing 250-4kHz sloping to mild to moderate sensorineural hearing loss 6-8kHz. Left ear 20dB worse at 6-8kHz. Speech Recognition Thresholds were obtained at 15dB HL in the right ear and at 15dB HL in the left ear. Word Recognition Testing was completed at  40dB SL and [name] scored 100% in each ear.    Results:  The test results were reviewed with [facility]. [name] has asymmetric sensorineural  hearing loss at 6-8Hz  with the left ear 20dB worse than the right across transducers. She has normal hearing 250-4kHz bilaterally. She has good  speech understanding. She has shallow middle ear function in both ears.  Audiogram printed and provided to [name]. Bring to Otolaryngology appointment.   Recommendations: Follow up with Otolaryngology due to asymmetric sensorineural  hearing loss and unilateral tinnitus.   30 minutes spent testing and counseling on results.   If you have any questions please feel free to contact me at [phone].  Lauraine Ka Stalnaker Au.D.  Audiologist   03/19/2024  2:23 PM  Cc: Varadarajan, Rupashree, MD  "

## 2024-03-22 ENCOUNTER — Ambulatory Visit (INDEPENDENT_AMBULATORY_CARE_PROVIDER_SITE_OTHER): Admitting: Physician Assistant

## 2024-03-23 ENCOUNTER — Other Ambulatory Visit (HOSPITAL_COMMUNITY): Payer: Self-pay

## 2024-03-23 ENCOUNTER — Ambulatory Visit (INDEPENDENT_AMBULATORY_CARE_PROVIDER_SITE_OTHER): Admitting: Physician Assistant

## 2024-03-23 ENCOUNTER — Encounter (INDEPENDENT_AMBULATORY_CARE_PROVIDER_SITE_OTHER): Payer: Self-pay | Admitting: Physician Assistant

## 2024-03-23 ENCOUNTER — Other Ambulatory Visit: Payer: Self-pay

## 2024-03-23 VITALS — BP 82/52 | HR 74

## 2024-03-23 DIAGNOSIS — R040 Epistaxis: Secondary | ICD-10-CM | POA: Diagnosis not present

## 2024-03-23 DIAGNOSIS — H9313 Tinnitus, bilateral: Secondary | ICD-10-CM

## 2024-03-23 DIAGNOSIS — H903 Sensorineural hearing loss, bilateral: Secondary | ICD-10-CM

## 2024-03-23 MED ORDER — DIAZEPAM 2 MG PO TABS
ORAL_TABLET | ORAL | 0 refills | Status: AC
  Filled 2024-03-23: qty 1, 1d supply, fill #0

## 2024-03-23 NOTE — Patient Instructions (Signed)
 I have ordered an imaging study for you to complete prior to your next visit. Please call [facility] Scheduling at [phone] to schedule your imaging if you have not received a call within 24 hours. If you are unable to complete your imaging study prior to your next scheduled visit please call our office to let us  know.

## 2024-03-26 NOTE — Progress Notes (Signed)
 Dear Dr. Elliot, Here is my assessment for our mutual patient, Miranda Hernandez. Thank you for allowing me the opportunity to care for your patient. Please do not hesitate to contact me should you have any other questions. Sincerely, Miranda Cohen PA-C  Otolaryngology Clinic Note Referring provider: Dr. Elliot HPI:  Miranda Hernandez is a 64 y.o. female kindly referred by Dr. Elliot   Discussed the use of AI scribe software for clinical note transcription with the patient, who gave verbal consent to proceed.  History of Present Illness    Miranda Hernandez is a 64 year old female with recurrent epistaxis and left-sided sensorineural hearing loss who presents for otolaryngology follow-up. She was last seen in the office on 02/22/2024. Below is a recap of that encounter.   She experiences recurrent left-sided epistaxis, occurring with increased frequency recently, up to three or four times a week. These episodes can occur spontaneously, such as during church, and are exacerbated by actions like blowing her nose or sneezing. She uses Flonase for allergies as needed rather than daily. She recalls visiting an ENT in the past but does not remember undergoing cauterization. She associates the epistaxis with dry weather and allergy season. No pain in the nose, trouble smelling, nasal obstruction, or trauma to the nose. She is not on any antiplatelets or anticoagulants and does not take aspirin.   She experiences tinnitus in her left ear for less than a month. The ringing is persistent, and she attempts to block it out. No hearing loss, pain, or history of ear infections. She experiences some dizziness, described as feeling 'out of sync' rather than vertiginous, and associates it with low blood pressure. No trauma to the ear or significant childhood ear infections.   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 She has two adult  children and grandchildren  Update 03/26/2024  Since her last visit, she experienced two minor episodes of epistaxis, both occurring approximately one week after her previous appointment. These episodes were less severe than prior events and resolved spontaneously, with one episode aborted at onset. No further bleeding has occurred. She has not used nasal sprays recently, though she previously used saline, and currently applies Vaseline intermittently to the nasal mucosa as recommended. She uses cotton balls for local control as needed.  She continues to experience left-sided tinnitus and perceives asymmetric sensorineural hearing loss, with greater impairment on the left, particularly at higher frequencies. Her hearing is generally adequate for daily activities and she does not use hearing aids. The tinnitus is tolerable and does not significantly impact her quality of life. She denies conductive symptoms such as otorrhea or aural fullness. She notes increased difficulty hearing higher-pitched voices and in environments with background noise.           Independent Review of Additional Tests or Records:  none   PMH/Meds/All/SocHx/FamHx/ROS:   Past Medical History:  Diagnosis Date   Anemia    Diabetes mellitus without complication (HCC)    HTN (hypertension)    Hypercholesteremia    Obesity      Past Surgical History:  Procedure Laterality Date   ABDOMINAL HYSTERECTOMY     CESAREAN SECTION     GASTRIC BYPASS      Family History  Problem Relation Age of Onset   Diabetes Mother    Heart disease Mother    Stroke Mother    Hypertension Mother    Heart disease Father  Hypertension Father    Breast cancer Cousin 46       paternal cousin   Arthritis Neg Hx    Colon cancer Neg Hx      Social Connections: Not on file     Current Medications[1]   Physical Exam:   BP (!) 82/52   Pulse 74   SpO2 98%   Pertinent Findings  CN II-XII grossly intact Bilateral EAC clear and  TM intact with well pneumatized middle ear spaces Anterior rhinoscopy: Septum midline; bilateral inferior turbinates with minimal hypertrophy, superficial blood vessels noted along the left anterior septum with area of irritation. No lesions of oral cavity/oropharynx; dentition within normal limits No obviously palpable neck masses/lymphadenopathy/thyromegaly No respiratory distress or stridor   Seprately Identifiable Procedures:  None  Impression & Plans:  Miranda Hernandez is a 64 y.o. female with the following   Assessment and Plan    Asymmetric sensorineural hearing loss with tinnitus Asymmetric sensorineural hearing loss, greater on the left, with tinnitus. Audiometry shows high-frequency loss on the left. Likely age-related degeneration. Asymmetry warrants evaluation for retrocochlear pathology. Low probability of significant lesion, but imaging needed to exclude treatable causes. - Ordered MRI of the brain and internal auditory canals to evaluate for retrocochlear pathology. - Provided instructions for MRI scheduling and discussed follow-up for results. - Will call with MRI results when available.  Recurrent epistaxis after nasal cautery Two minor episodes of epistaxis post-cautery, resolved spontaneously. Examination shows superficial vessel at prior cautery site, no intervention needed. - Advised continued conservative management with Vaseline application to anterior nasal septum. - Nasal saline sprays not necessary. - Deferred further cautery unless epistaxis becomes more frequent or severe. - Provided anticipatory guidance on minor irritation and bleeding during healing.       - f/u Phone call with MRI results    Thank you for allowing me the opportunity to care for your patient. Please do not hesitate to contact me should you have any other questions.  Sincerely, Miranda Cohen PA-C [facility] Phone: [phone] Fax: [phone]  03/26/2024, 12:52 PM         [1]  Current Outpatient Medications:    diazepam  (VALIUM ) 2 MG tablet, Take one tab 1 hour prior to MRI as needed for anxiety, Disp: 1 tablet, Rfl: 0   amLODipine  (NORVASC ) 10 MG tablet, Take 1 tablet by mouth once a day, Disp: 90 tablet, Rfl: 3   amLODipine  (NORVASC ) 10 MG tablet, Take 1 tablet (10 mg total) by mouth daily. (Patient not taking: Reported on 02/22/2024), Disp: 90 tablet, Rfl: 5   amLODipine  (NORVASC ) 10 MG tablet, Take 1 tablet (10 mg total) by mouth daily. (Patient not taking: Reported on 02/22/2024), Disp: 90 tablet, Rfl: 5   ascorbic acid (VITAMIN C) 500 MG tablet, Take 500 mg by mouth as needed., Disp: , Rfl:    benazepril  (LOTENSIN ) 40 MG tablet, TAKE 1 TABLET BY MOUTH ONCE DAILY (Patient not taking: Reported on 02/22/2024), Disp: 90 tablet, Rfl: 3   benazepril  (LOTENSIN ) 40 MG tablet, Take 1 tablet (40 mg total) by mouth daily., Disp: 90 tablet, Rfl: 5   benazepril  (LOTENSIN ) 40 MG tablet, Take 1 tablet (40 mg total) by mouth daily. (Patient not taking: Reported on 02/22/2024), Disp: 90 tablet, Rfl: 5   cyanocobalamin  (,VITAMIN B-12,) 1000 MCG/ML injection, Inject 1 ml intramuscularly once a month as directed (Patient not taking: Reported on 02/22/2024), Disp: 3 mL, Rfl: 1   dapagliflozin  propanediol (FARXIGA ) 5 MG TABS tablet, Take  1 tablet (5 mg total) by mouth daily., Disp: 90 tablet, Rfl: 3   dapagliflozin  propanediol (FARXIGA ) 5 MG TABS tablet, Take 1 tablet (5 mg total) by mouth daily. (Patient not taking: Reported on 02/22/2024), Disp: 90 tablet, Rfl: 3   dapagliflozin  propanediol (FARXIGA ) 5 MG TABS tablet, Take 1 tablet (5 mg total) by mouth daily. (Patient not taking: Reported on 02/22/2024), Disp: 30 tablet, Rfl: 11   dapagliflozin  propanediol (FARXIGA ) 5 MG TABS tablet, Take 1 tablet (5 mg total) by mouth daily. (Patient not taking: Reported on 02/22/2024), Disp: 90 tablet, Rfl: 5   docusate sodium (COLACE) 100 MG capsule, Take 100 mg by mouth 2 (two)  times daily. (Patient not taking: Reported on 02/22/2024), Disp: , Rfl:    Dulaglutide  (TRULICITY ) 1.5 MG/0.5ML SOAJ, Inject 1.5 mg into the skin once a week as directed (Patient not taking: Reported on 02/22/2024), Disp: 6 mL, Rfl: 4   Dulaglutide  (TRULICITY ) 3 MG/0.5ML SOAJ, Inject 3 mg into the skin once a week., Disp: 6 mL, Rfl: 1   estradiol  (ESTRACE ) 0.1 MG/GM vaginal cream, Insert into vagina 1 gram every night at bedtime for 2 weeks, then insert 1 gram into vagina twice a week at bedtime (e.g every monday and thursday). (Patient not taking: Reported on 02/22/2024), Disp: 42.5 g, Rfl: 3   ferrous sulfate 325 (65 FE) MG tablet, Take 325 mg by mouth daily with breakfast. (Patient taking differently: Take 325 mg by mouth daily with breakfast. As needed), Disp: , Rfl:    fluconazole  (DIFLUCAN ) 150 MG tablet, Take 1 tablet (150 mg total) by mouth every 3 (three) days, for 2 doses for vaginal yeast infection (Patient not taking: Reported on 02/22/2024), Disp: 2 tablet, Rfl: 0   hydrochlorothiazide  (HYDRODIURIL ) 25 MG tablet, Take 1 tablet by mouth daily., Disp: 90 tablet, Rfl: 3   hydrochlorothiazide  (HYDRODIURIL ) 25 MG tablet, Take 1 tablet (25 mg total) by mouth daily. (Patient not taking: Reported on 02/22/2024), Disp: 90 tablet, Rfl: 3   hydrochlorothiazide  (HYDRODIURIL ) 25 MG tablet, Take 1 tablet (25 mg total) by mouth in the morning. (Patient not taking: Reported on 02/22/2024), Disp: 90 tablet, Rfl: 5   hydrochlorothiazide  (HYDRODIURIL ) 25 MG tablet, Take 1 tablet (25 mg total) by mouth in the morning. (Patient not taking: Reported on 02/22/2024), Disp: 90 tablet, Rfl: 5   ibuprofen  (ADVIL ,MOTRIN ) 600 MG tablet, Take 1 tablet (600 mg total) by mouth every 8 (eight) hours as needed. (Patient not taking: Reported on 02/22/2024), Disp: 30 tablet, Rfl: 0   loratadine (CLARITIN) 10 MG tablet, Take 10 mg by mouth daily as needed., Disp: , Rfl:    metFORMIN  (GLUCOPHAGE -XR) 500 MG 24 hr tablet, Take 2  tablets (1,000 mg total) by mouth daily. (Patient not taking: Reported on 02/22/2024), Disp: 180 tablet, Rfl: 3   metFORMIN  (GLUCOPHAGE -XR) 500 MG 24 hr tablet, Take 2 tablets (1,000 mg total) by mouth daily. (Patient not taking: Reported on 02/22/2024), Disp: 180 tablet, Rfl: 3   metFORMIN  (GLUCOPHAGE -XR) 500 MG 24 hr tablet, Take 2 tablets (1,000 mg total) by mouth 2 (two) times daily., Disp: 360 tablet, Rfl: 5   metoprolol  succinate (TOPROL -XL) 100 MG 24 hr tablet, TAKE 1 & 1/2 TABLETS BY MOUTH DAILY, Disp: 135 tablet, Rfl: 1   metoprolol  succinate (TOPROL -XL) 100 MG 24 hr tablet, Take 1.5 tablets (150 mg total) by mouth daily. (Patient not taking: Reported on 02/22/2024), Disp: 135 tablet, Rfl: 3   metoprolol  succinate (TOPROL -XL) 100 MG 24 hr tablet, Take 1.5  tablets (150 mg total) by mouth daily. (Patient not taking: Reported on 02/22/2024), Disp: 145 tablet, Rfl: 3   metoprolol  succinate (TOPROL -XL) 100 MG 24 hr tablet, Take 1.5 tablets (150 mg total) by mouth daily. (Patient not taking: Reported on 02/22/2024), Disp: 135 tablet, Rfl: 5   metoprolol  succinate (TOPROL -XL) 100 MG 24 hr tablet, Take 1.5 tablets (150 mg total) by mouth daily. (Patient not taking: Reported on 02/22/2024), Disp: 135 tablet, Rfl: 5   metoprolol  tartrate (LOPRESSOR ) 100 MG tablet, Take by mouth 2 hours prior to CT as directed., Disp: 1 tablet, Rfl: 0   mometasone  (NASONEX ) 50 MCG/ACT nasal spray, Place 2 sprays into the nose as needed., Disp: , Rfl:    mometasone  (NASONEX ) 50 MCG/ACT nasal spray, PLACE 2 SPRAYS IN EACH NOSTRIL ONCE A DAY (Patient not taking: Reported on 02/22/2024), Disp: 17 g, Rfl: 4   rosuvastatin  (CRESTOR ) 5 MG tablet, Take 1 tablet by mouth daily., Disp: 90 tablet, Rfl: 3   rosuvastatin  (CRESTOR ) 5 MG tablet, Take 1 tablet (5 mg total) by mouth daily. (Patient not taking: Reported on 02/22/2024), Disp: 90 tablet, Rfl: 4   rosuvastatin  (CRESTOR ) 5 MG tablet, Take 1 tablet (5 mg total) by mouth daily.  (Patient not taking: Reported on 02/22/2024), Disp: 90 tablet, Rfl: 4   rosuvastatin  (CRESTOR ) 5 MG tablet, Take 1 tablet (5 mg total) by mouth daily. (Patient not taking: Reported on 02/22/2024), Disp: 90 tablet, Rfl: 5   testosterone (ANDROGEL) 50 MG/5GM (1%) GEL, APPLY 1/2 GRAM TO ARM,LEGS AND ABDOMEN ONCE A DAY (Patient not taking: Reported on 02/22/2024), Disp: 150 g, Rfl: 2   Vitamin D , Ergocalciferol , (DRISDOL ) 1.25 MG (50000 UT) CAPS capsule, TAKE 1 CAPSULE (50,000 UNITS TOTAL) BY MOUTH EVERY 7 (SEVEN) DAYS. (Patient not taking: Reported on 02/22/2024), Disp: 12 capsule, Rfl: 0

## 2024-04-14 ENCOUNTER — Ambulatory Visit (HOSPITAL_COMMUNITY)
